# Patient Record
Sex: Male | Born: 1990 | Race: Black or African American | Hispanic: No | Marital: Married | Smoking: Never smoker
Health system: Southern US, Community
[De-identification: ages and names within clinical notes are randomized; demographics above are authoritative.]

## PROBLEM LIST (undated history)

## (undated) DIAGNOSIS — E119 Type 2 diabetes mellitus without complications: Secondary | ICD-10-CM

## (undated) HISTORY — DX: Type 2 diabetes mellitus without complications: E11.9

---
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
## 2016-04-11 ENCOUNTER — Emergency Department (HOSPITAL_BASED_OUTPATIENT_CLINIC_OR_DEPARTMENT_OTHER)
Admission: EM | Admit: 2016-04-11 | Discharge: 2016-04-11 | Disposition: A | Discharge status: Home / Self Care | Payer: Self-pay | Attending: Emergency Medicine | Admitting: Emergency Medicine

## 2016-04-11 ENCOUNTER — Encounter (HOSPITAL_BASED_OUTPATIENT_CLINIC_OR_DEPARTMENT_OTHER): Payer: Self-pay | Admitting: Emergency Medicine

## 2016-04-11 DIAGNOSIS — E119 Type 2 diabetes mellitus without complications: Secondary | ICD-10-CM | POA: Insufficient documentation

## 2016-04-11 DIAGNOSIS — A084 Viral intestinal infection, unspecified: Secondary | ICD-10-CM | POA: Insufficient documentation

## 2016-04-11 LAB — COMPREHENSIVE METABOLIC PANEL
ALT: 78 U/L — ABNORMAL HIGH (ref 17–63)
ANION GAP: 13 (ref 5–15)
AST: 49 U/L — ABNORMAL HIGH (ref 15–41)
Albumin: 4.5 g/dL (ref 3.5–5.0)
Alkaline Phosphatase: 95 U/L (ref 38–126)
BILIRUBIN TOTAL: 0.9 mg/dL (ref 0.3–1.2)
BUN: 8 mg/dL (ref 6–20)
CALCIUM: 9.1 mg/dL (ref 8.9–10.3)
CO2: 24 mmol/L (ref 22–32)
CREATININE: 0.9 mg/dL (ref 0.61–1.24)
Chloride: 97 mmol/L — ABNORMAL LOW (ref 101–111)
Glucose, Bld: 381 mg/dL — ABNORMAL HIGH (ref 65–99)
Potassium: 3.7 mmol/L (ref 3.5–5.1)
SODIUM: 134 mmol/L — AB (ref 135–145)
TOTAL PROTEIN: 7.4 g/dL (ref 6.5–8.1)

## 2016-04-11 LAB — URINALYSIS, ROUTINE W REFLEX MICROSCOPIC
Bilirubin Urine: NEGATIVE
Glucose, UA: >=500 mg/dL — AB
HGB URINE DIPSTICK: NEGATIVE
Ketones, ur: >80 mg/dL — AB
Leukocytes, UA: NEGATIVE
NITRITE: NEGATIVE
PROTEIN: NEGATIVE mg/dL
Specific Gravity, Urine: >1.046 — ABNORMAL HIGH (ref 1.005–1.030)
pH: 5 (ref 5.0–8.0)

## 2016-04-11 LAB — URINALYSIS, MICROSCOPIC (REFLEX): Bacteria, UA: NONE SEEN

## 2016-04-11 LAB — CBC WITH DIFFERENTIAL/PLATELET
BASOS ABS: 0.1 10*3/uL (ref 0.0–0.1)
BASOS PCT: 1 %
EOS ABS: 0.1 10*3/uL (ref 0.0–0.7)
Eosinophils Relative: 1 %
HEMATOCRIT: 44.9 % (ref 39.0–52.0)
HEMOGLOBIN: 14.9 g/dL (ref 13.0–17.0)
Lymphocytes Relative: 30 %
Lymphs Abs: 3 10*3/uL (ref 0.7–4.0)
MCH: 27.5 pg (ref 26.0–34.0)
MCHC: 33.2 g/dL (ref 30.0–36.0)
MCV: 82.8 fL (ref 78.0–100.0)
Monocytes Absolute: 0.8 10*3/uL (ref 0.1–1.0)
Monocytes Relative: 8 %
NEUTROS ABS: 6.1 10*3/uL (ref 1.7–7.7)
NEUTROS PCT: 60 %
Platelets: 290 10*3/uL (ref 150–400)
RBC: 5.42 MIL/uL (ref 4.22–5.81)
RDW: 12.4 % (ref 11.5–15.5)
WBC: 10.1 10*3/uL (ref 4.0–10.5)

## 2016-04-11 LAB — LIPASE, BLOOD: LIPASE: 23 U/L (ref 11–51)

## 2016-04-11 MED ORDER — SODIUM CHLORIDE 0.9 % IV BOLUS (SEPSIS)
1000.0000 mL | Freq: Once | INTRAVENOUS | Status: AC
  Administered 2016-04-11: 1000 mL via INTRAVENOUS

## 2016-04-11 MED ORDER — ONDANSETRON 8 MG PO TBDP: 8.0000 mg | ORAL_TABLET | Freq: Three times a day (TID) | ORAL | 0 refills | Status: DC | PRN

## 2016-04-11 MED ORDER — ONDANSETRON HCL 4 MG/2ML IJ SOLN
4.0000 mg | Freq: Once | INTRAMUSCULAR | Status: AC
  Administered 2016-04-11: 4 mg via INTRAVENOUS
  Filled 2016-04-11: qty 2

## 2016-04-11 MED ORDER — DICYCLOMINE HCL 10 MG/ML IM SOLN
20.0000 mg | Freq: Once | INTRAMUSCULAR | Status: AC
  Administered 2016-04-11: 20 mg via INTRAMUSCULAR
  Filled 2016-04-11: qty 2

## 2016-04-11 MED ORDER — INSULIN ASPART 100 UNIT/ML ~~LOC~~ SOLN
10.0000 [IU] | Freq: Once | SUBCUTANEOUS | Status: AC
  Administered 2016-04-11: 10 [IU] via SUBCUTANEOUS
  Filled 2016-04-11: qty 1

## 2016-04-11 MED ORDER — METFORMIN HCL 500 MG PO TABS: 500.0000 mg | ORAL_TABLET | Freq: Two times a day (BID) | ORAL | 0 refills | Status: DC

## 2016-04-11 NOTE — ED Provider Notes (Signed)
[facility] Provider Note: Erik Ford Trestin Vences, MD, FACEP  CSN: [id number] MRN: [id number] ARRIVAL: 04/11/16 at 0028 ROOM: [facility]10/[facility]10   CHIEF COMPLAINT  Abdominal Pain   HISTORY OF PRESENT ILLNESS  Erik Ford is a 26 y.o. male with a one-week history (4 day history per nurse's notes) of crampy, diffuse abdominal pain associated with nausea and the urge to vomit with very little actual emesis. He has also had the urge to move his bowels but has passed very little stool this week due to decreased food intake. He has had "a little bit" of diarrhea. He denies blood in the stool. He states he has continued to try and stay hydrated. He had a fever yesterday evening.   History reviewed. No pertinent past medical history.  History reviewed. No pertinent surgical history.  No family history on file.  Social History  Substance Use Topics  . Smoking status: Never Smoker  . Smokeless tobacco: Never Used  . Alcohol use No    Prior to Admission medications   Not on File    Allergies Patient has no known allergies.   REVIEW OF SYSTEMS  Negative except as noted here or in the History of Present Illness.   PHYSICAL EXAMINATION  Initial Vital Signs Blood pressure 133/69, pulse 79, temperature 98.8 F (37.1 C), temperature source Oral, resp. rate 18, height 5\' 6"  (1.676 m), weight 228 lb (103.4 kg), SpO2 99 %.  Examination General: Well-developed, well-nourished male in no acute distress; appearance consistent with age of record HENT: normocephalic; atraumatic Eyes: pupils equal, round and reactive to light; extraocular muscles intact Neck: supple Heart: regular rate and rhythm Lungs: clear to auscultation bilaterally Abdomen: soft; nondistended; Diffusely tender most prominent in the epigastrium and right upper quadrant; no masses or hepatosplenomegaly; bowel sounds hyperactive Extremities: No deformity; full range of motion; pulses normal Neurologic: Awake,  alert and oriented; motor function intact in all extremities and symmetric; no facial droop Skin: Warm and dry Psychiatric: Normal mood and affect   RESULTS  Summary of this visit's results, reviewed by myself:   EKG Interpretation  Date/Time:    Ventricular Rate:    PR Interval:    QRS Duration:   QT Interval:    QTC Calculation:   R Axis:     Text Interpretation:        Laboratory Studies: Results for orders placed or performed during the hospital encounter of 04/11/16 (from the past 24 hour(s))  Comprehensive metabolic panel     Status: Abnormal   Collection Time: 04/11/16  4:30 AM  Result Value Ref Range   Sodium 134 (L) 135 - 145 mmol/L   Potassium 3.7 3.5 - 5.1 mmol/L   Chloride 97 (L) 101 - 111 mmol/L   CO2 24 22 - 32 mmol/L   Glucose, Bld 381 (H) 65 - 99 mg/dL   BUN 8 6 - 20 mg/dL   Creatinine, Ser 7.820.90 0.61 - 1.24 mg/dL   Calcium 9.1 8.9 - 95.610.3 mg/dL   Total Protein 7.4 6.5 - 8.1 g/dL   Albumin 4.5 3.5 - 5.0 g/dL   AST 49 (H) 15 - 41 U/L   ALT 78 (H) 17 - 63 U/L   Alkaline Phosphatase 95 38 - 126 U/L   Total Bilirubin 0.9 0.3 - 1.2 mg/dL   GFR calc non Af Amer >60 >60 mL/min   GFR calc Af Amer >60 >60 mL/min   Anion gap 13 5 - 15  CBC with Differential/Platelet  Status: None   Collection Time: 04/11/16  4:30 AM  Result Value Ref Range   WBC 10.1 4.0 - 10.5 K/uL   RBC 5.42 4.22 - 5.81 MIL/uL   Hemoglobin 14.9 13.0 - 17.0 g/dL   HCT 53.6 64.4 - 03.4 %   MCV 82.8 78.0 - 100.0 fL   MCH 27.5 26.0 - 34.0 pg   MCHC 33.2 30.0 - 36.0 g/dL   RDW 74.2 59.5 - 63.8 %   Platelets 290 150 - 400 K/uL   Neutrophils Relative % 60 %   Neutro Abs 6.1 1.7 - 7.7 K/uL   Lymphocytes Relative 30 %   Lymphs Abs 3.0 0.7 - 4.0 K/uL   Monocytes Relative 8 %   Monocytes Absolute 0.8 0.1 - 1.0 K/uL   Eosinophils Relative 1 %   Eosinophils Absolute 0.1 0.0 - 0.7 K/uL   Basophils Relative 1 %   Basophils Absolute 0.1 0.0 - 0.1 K/uL  Lipase, blood     Status: None    Collection Time: 04/11/16  4:30 AM  Result Value Ref Range   Lipase 23 11 - 51 U/L  Urinalysis, Routine w reflex microscopic     Status: Abnormal   Collection Time: 04/11/16  5:54 AM  Result Value Ref Range   Color, Urine YELLOW YELLOW   APPearance CLEAR CLEAR   Specific Gravity, Urine >1.046 (H) 1.005 - 1.030   pH 5.0 5.0 - 8.0   Glucose, UA >=500 (A) NEGATIVE mg/dL   Hgb urine dipstick NEGATIVE NEGATIVE   Bilirubin Urine NEGATIVE NEGATIVE   Ketones, ur >80 (A) NEGATIVE mg/dL   Protein, ur NEGATIVE NEGATIVE mg/dL   Nitrite NEGATIVE NEGATIVE   Leukocytes, UA NEGATIVE NEGATIVE  Urinalysis, Microscopic (reflex)     Status: Abnormal   Collection Time: 04/11/16  5:54 AM  Result Value Ref Range   RBC / HPF 0-5 0 - 5 RBC/hpf   WBC, UA 0-5 0 - 5 WBC/hpf   Bacteria, UA NONE SEEN NONE SEEN   Squamous Epithelial / LPF 0-5 (A) NONE SEEN   Imaging Studies: No results found.  ED COURSE  Nursing notes and initial vitals signs, including pulse oximetry, reviewed.  Vitals:   04/11/16 0042 04/11/16 0245 04/11/16 0246 04/11/16 0557  BP: 132/80 133/69 133/69 121/76  Pulse: 92  79 80  Resp: 18  18 19   Temp: 98.8 F (37.1 C)   98.8 F (37.1 C)  TempSrc: Oral   Oral  SpO2: 98%  99% 99%  Weight: 228 lb (103.4 kg)     Height: 5\' 6"  (1.676 m)      6:20 AM Patient feeling much better after IV fluids and medications. Able to drink fluids without emesis. Emphasized the importance of getting his newly diagnosed diabetes.with by primary care physician on a regular basis. He intends to start looking for PCP tomorrow. We will start him on metformin in the meantime.  PROCEDURES    ED DIAGNOSES     ICD-9-CM ICD-10-CM   1. Viral gastroenteritis 008.8 A08.4   2. New onset type 2 diabetes mellitus (HCC) 250.00 E11.9        Erik Libra, MD 04/11/16 [phone]

## 2016-04-11 NOTE — ED Triage Notes (Signed)
Pt reports diffuse abdominal pain and cramping since Wednesday.  Pt states he vomited 5+ times this week, but today just feels nauseous.

## 2017-09-09 ENCOUNTER — Other Ambulatory Visit: Payer: Self-pay

## 2017-09-09 ENCOUNTER — Emergency Department (HOSPITAL_BASED_OUTPATIENT_CLINIC_OR_DEPARTMENT_OTHER): Payer: BC Managed Care – PPO

## 2017-09-09 ENCOUNTER — Emergency Department (HOSPITAL_BASED_OUTPATIENT_CLINIC_OR_DEPARTMENT_OTHER)
Admission: EM | Admit: 2017-09-09 | Discharge: 2017-09-09 | Disposition: A | Discharge status: Home / Self Care | Payer: BC Managed Care – PPO | Attending: Emergency Medicine | Admitting: Emergency Medicine

## 2017-09-09 ENCOUNTER — Encounter (HOSPITAL_BASED_OUTPATIENT_CLINIC_OR_DEPARTMENT_OTHER): Payer: Self-pay | Admitting: *Deleted

## 2017-09-09 DIAGNOSIS — Z7984 Long term (current) use of oral hypoglycemic drugs: Secondary | ICD-10-CM | POA: Diagnosis not present

## 2017-09-09 DIAGNOSIS — S299XXA Unspecified injury of thorax, initial encounter: Secondary | ICD-10-CM | POA: Diagnosis present

## 2017-09-09 DIAGNOSIS — Y92410 Unspecified street and highway as the place of occurrence of the external cause: Secondary | ICD-10-CM | POA: Diagnosis not present

## 2017-09-09 DIAGNOSIS — S20211A Contusion of right front wall of thorax, initial encounter: Secondary | ICD-10-CM | POA: Diagnosis not present

## 2017-09-09 DIAGNOSIS — Y999 Unspecified external cause status: Secondary | ICD-10-CM | POA: Insufficient documentation

## 2017-09-09 DIAGNOSIS — M79601 Pain in right arm: Secondary | ICD-10-CM | POA: Diagnosis not present

## 2017-09-09 DIAGNOSIS — Y9389 Activity, other specified: Secondary | ICD-10-CM | POA: Diagnosis not present

## 2017-09-09 MED ORDER — NAPROXEN 500 MG PO TABS: 500.0000 mg | ORAL_TABLET | Freq: Two times a day (BID) | ORAL | 0 refills | Status: DC | PRN

## 2017-09-09 MED ORDER — CYCLOBENZAPRINE HCL 10 MG PO TABS: 10.0000 mg | ORAL_TABLET | Freq: Two times a day (BID) | ORAL | 0 refills | Status: DC | PRN

## 2017-09-09 MED ORDER — NAPROXEN 250 MG PO TABS
500.0000 mg | ORAL_TABLET | Freq: Once | ORAL | Status: AC
  Administered 2017-09-09: 500 mg via ORAL
  Filled 2017-09-09: qty 2

## 2017-09-09 MED ORDER — HYDROCODONE-ACETAMINOPHEN 5-325 MG PO TABS
1.0000 | ORAL_TABLET | Freq: Once | ORAL | Status: AC
  Administered 2017-09-09: 1 via ORAL
  Filled 2017-09-09: qty 1

## 2017-09-09 MED ORDER — HYDROCODONE-ACETAMINOPHEN 5-325 MG PO TABS: 1.0000 | ORAL_TABLET | Freq: Four times a day (QID) | ORAL | 0 refills | Status: DC | PRN
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
## 2017-09-09 NOTE — ED Notes (Signed)
Patient transported to X-ray 
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
## 2019-04-23 ENCOUNTER — Ambulatory Visit: Payer: BC Managed Care – PPO | Attending: Internal Medicine

## 2019-04-23 DIAGNOSIS — Z20822 Contact with and (suspected) exposure to covid-19: Secondary | ICD-10-CM

## 2019-04-24 LAB — NOVEL CORONAVIRUS, NAA: SARS-CoV-2, NAA: NOT DETECTED

## 2019-10-30 ENCOUNTER — Ambulatory Visit: Payer: BC Managed Care – PPO | Attending: Critical Care Medicine

## 2019-10-30 DIAGNOSIS — Z23 Encounter for immunization: Secondary | ICD-10-CM

## 2019-10-30 NOTE — Progress Notes (Signed)
   Covid-19 Vaccination Clinic  Name:  TRUETT MCFARLAN    MRN: [id number] DOB: Dec 09, 1990  10/30/2019  Mr. Chay was observed post Covid-19 immunization for 15 minutes without incident. He was provided with Vaccine Information Sheet and instruction to access the V-Safe system.   Mr. Einstein was instructed to call 911 with any severe reactions post vaccine: Marland Kitchen Difficulty breathing  . Swelling of face and throat  . A fast heartbeat  . A bad rash all over body  . Dizziness and weakness   Immunizations Administered    Name Date Dose VIS Date Route   Pfizer COVID-19 Vaccine 10/30/2019  1:07 PM 0.3 mL 05/30/2018 Intramuscular   Manufacturer: [employer], [employer]   Lot: [id number]   NDC: [address]-1

## 2019-11-20 ENCOUNTER — Ambulatory Visit: Payer: Self-pay | Attending: Internal Medicine

## 2019-11-20 DIAGNOSIS — Z23 Encounter for immunization: Secondary | ICD-10-CM

## 2019-11-20 NOTE — Progress Notes (Signed)
   Covid-19 Vaccination Clinic  Name:  Erik Ford    MRN: [id number] DOB: 1991/01/25  11/20/2019  Mr. [name] was observed post Covid-19 immunization for 15 minutes without incident. He was provided with Vaccine Information Sheet and instruction to access the V-Safe system.   [name] was instructed to call 911 with any severe reactions post vaccine: Marland Kitchen Difficulty breathing  . Swelling of face and throat  . A fast heartbeat  . A bad rash all over body  . Dizziness and weakness   Immunizations Administered    Name Date Dose VIS Date Route   Pfizer COVID-19 Vaccine 11/20/2019 12:49 PM 0.3 mL 05/30/2018 Intramuscular   Manufacturer: [employer], [employer]   Lot: [id number]   NDC: [address]-6

## 2020-08-21 ENCOUNTER — Encounter: Payer: Self-pay | Admitting: Nurse Practitioner

## 2020-08-21 ENCOUNTER — Other Ambulatory Visit: Payer: Self-pay

## 2020-08-21 ENCOUNTER — Ambulatory Visit: Payer: 59 | Admitting: Nurse Practitioner

## 2020-08-21 VITALS — BP 124/78 | HR 100 | Temp 97.0°F | Ht 66.0 in | Wt 191.6 lb

## 2020-08-21 DIAGNOSIS — E1141 Type 2 diabetes mellitus with diabetic mononeuropathy: Secondary | ICD-10-CM | POA: Diagnosis not present

## 2020-08-21 DIAGNOSIS — G629 Polyneuropathy, unspecified: Secondary | ICD-10-CM

## 2020-08-21 DIAGNOSIS — E1169 Type 2 diabetes mellitus with other specified complication: Secondary | ICD-10-CM | POA: Diagnosis not present

## 2020-08-21 DIAGNOSIS — E1140 Type 2 diabetes mellitus with diabetic neuropathy, unspecified: Secondary | ICD-10-CM | POA: Insufficient documentation

## 2020-08-21 DIAGNOSIS — E1165 Type 2 diabetes mellitus with hyperglycemia: Secondary | ICD-10-CM | POA: Insufficient documentation

## 2020-08-21 DIAGNOSIS — F3289 Other specified depressive episodes: Secondary | ICD-10-CM | POA: Insufficient documentation

## 2020-08-21 LAB — CBC
HCT: 43.8 % (ref 39.0–52.0)
Hemoglobin: 14.5 g/dL (ref 13.0–17.0)
MCHC: 33.2 g/dL (ref 30.0–36.0)
MCV: 83.4 fl (ref 78.0–100.0)
Platelets: 297 10*3/uL (ref 150.0–400.0)
RBC: 5.25 Mil/uL (ref 4.22–5.81)
RDW: 12.6 % (ref 11.5–15.5)
WBC: 8.8 10*3/uL (ref 4.0–10.5)

## 2020-08-21 LAB — COMPREHENSIVE METABOLIC PANEL
ALT: 58 U/L — ABNORMAL HIGH (ref 0–53)
AST: 33 U/L (ref 0–37)
Albumin: 4.6 g/dL (ref 3.5–5.2)
Alkaline Phosphatase: 76 U/L (ref 39–117)
BUN: 11 mg/dL (ref 6–23)
CO2: 28 mEq/L (ref 19–32)
Calcium: 9.5 mg/dL (ref 8.4–10.5)
Chloride: 102 mEq/L (ref 96–112)
Creatinine, Ser: 0.92 mg/dL (ref 0.40–1.50)
GFR: 114.1 mL/min (ref >60.00)
Glucose, Bld: 334 mg/dL — ABNORMAL HIGH (ref 70–99)
Potassium: 4 mEq/L (ref 3.5–5.1)
Sodium: 138 mEq/L (ref 135–145)
Total Bilirubin: 0.3 mg/dL (ref 0.2–1.2)
Total Protein: 6.9 g/dL (ref 6.0–8.3)

## 2020-08-21 LAB — TSH: TSH: 2.63 u[IU]/mL (ref 0.35–4.50)

## 2020-08-21 LAB — GLUCOSE, POCT (MANUAL RESULT ENTRY): POC Glucose: 308 mg/dl — AB (ref 70–99)

## 2020-08-21 LAB — B12 AND FOLATE PANEL
Folate: 13.2 ng/mL (ref >5.9)
Vitamin B-12: 603 pg/mL (ref 211–911)

## 2020-08-21 LAB — MICROALBUMIN / CREATININE URINE RATIO
Creatinine,U: 125.6 mg/dL
Microalb Creat Ratio: 0.7 mg/g (ref 0.0–30.0)
Microalb, Ur: 0.9 mg/dL (ref 0.0–1.9)

## 2020-08-21 LAB — HEMOGLOBIN A1C: Hgb A1c MFr Bld: 11.7 % — ABNORMAL HIGH (ref 4.6–6.5)

## 2020-08-21 MED ORDER — GABAPENTIN 100 MG PO CAPS: 100.0000 mg | ORAL_CAPSULE | Freq: Every day | ORAL | 3 refills | Status: DC

## 2020-08-21 NOTE — Patient Instructions (Signed)
Thank you for choosing [facility]  Go to lab for blood draw and urine collection Schedule annual eye exam with ophthalmology.  Diabetes Mellitus and Nutrition, Adult When you have diabetes, or diabetes mellitus, it is very important to have healthy eating habits because your blood sugar (glucose) levels are greatly affected by what you eat and drink. Eating healthy foods in the right amounts, at about the same times every day, can help you:  Control your blood glucose.  Lower your risk of heart disease.  Improve your blood pressure.  Reach or maintain a healthy weight. What can affect my meal plan? Every person with diabetes is different, and each person has different needs for a meal plan. Your health care provider may recommend that you work with a dietitian to make a meal plan that is best for you. Your meal plan may vary depending on factors such as:  The calories you need.  The medicines you take.  Your weight.  Your blood glucose, blood pressure, and cholesterol levels.  Your activity level.  Other health conditions you have, such as heart or kidney disease. How do carbohydrates affect me? Carbohydrates, also called carbs, affect your blood glucose level more than any other type of food. Eating carbs naturally raises the amount of glucose in your blood. Carb counting is a method for keeping track of how many carbs you eat. Counting carbs is important to keep your blood glucose at a healthy level, especially if you use insulin or take certain oral diabetes medicines. It is important to know how many carbs you can safely have in each meal. This is different for every person. Your dietitian can help you calculate how many carbs you should have at each meal and for each snack. How does alcohol affect me? Alcohol can cause a sudden decrease in blood glucose (hypoglycemia), especially if you use insulin or take certain oral diabetes medicines. Hypoglycemia can be a  life-threatening condition. Symptoms of hypoglycemia, such as sleepiness, dizziness, and confusion, are similar to symptoms of having too much alcohol.  Do not drink alcohol if: ? Your health care provider tells you not to drink. ? You are pregnant, may be pregnant, or are planning to become pregnant.  If you drink alcohol: ? Do not drink on an empty stomach. ? Limit how much you use to:  0-1 drink a day for women.  0-2 drinks a day for men. ? Be aware of how much alcohol is in your drink. In the [place] one drink equals one 12 oz bottle of beer (355 mL), one 5 oz glass of wine (148 mL), or one 1 oz glass of hard liquor (44 mL). ? Keep yourself hydrated with water, diet soda, or unsweetened iced tea.  Keep in mind that regular soda, juice, and other mixers may contain a lot of sugar and must be counted as carbs. What are tips for following this plan? Reading food labels  Start by checking the serving size on the "Nutrition Facts" label of packaged foods and drinks. The amount of calories, carbs, fats, and other nutrients listed on the label is based on one serving of the item. Many items contain more than one serving per package.  Check the total grams (g) of carbs in one serving. You can calculate the number of servings of carbs in one serving by dividing the total carbs by 15. For example, if a food has 30 g of total carbs per serving, it would be equal to  2 servings of carbs.  Check the number of grams (g) of saturated fats and trans fats in one serving. Choose foods that have a low amount or none of these fats.  Check the number of milligrams (mg) of salt (sodium) in one serving. Most people should limit total sodium intake to less than 2,300 mg per day.  Always check the nutrition information of foods labeled as "low-fat" or "nonfat." These foods may be higher in added sugar or refined carbs and should be avoided.  Talk to your dietitian to identify your daily goals for nutrients  listed on the label. Shopping  Avoid buying canned, pre-made, or processed foods. These foods tend to be high in fat, sodium, and added sugar.  Shop around the outside edge of the grocery store. This is where you will most often find fresh fruits and vegetables, bulk grains, fresh meats, and fresh dairy. Cooking  Use low-heat cooking methods, such as baking, instead of high-heat cooking methods like deep frying.  Cook using healthy oils, such as olive, canola, or sunflower oil.  Avoid cooking with butter, cream, or high-fat meats. Meal planning  Eat meals and snacks regularly, preferably at the same times every day. Avoid going long periods of time without eating.  Eat foods that are high in fiber, such as fresh fruits, vegetables, beans, and whole grains. Talk with your dietitian about how many servings of carbs you can eat at each meal.  Eat 4-6 oz (112-168 g) of lean protein each day, such as lean meat, chicken, fish, eggs, or tofu. One ounce (oz) of lean protein is equal to: ? 1 oz (28 g) of meat, chicken, or fish. ? 1 egg. ?  cup (62 g) of tofu.  Eat some foods each day that contain healthy fats, such as avocado, nuts, seeds, and fish.   What foods should I eat? Fruits Berries. Apples. Oranges. Peaches. Apricots. Plums. Grapes. Mango. Papaya. Pomegranate. Kiwi. Cherries. Vegetables Lettuce. Spinach. Leafy greens, including kale, chard, collard greens, and mustard greens. Beets. Cauliflower. Cabbage. Broccoli. Carrots. Green beans. Tomatoes. Peppers. Onions. Cucumbers. Brussels sprouts. Grains Whole grains, such as whole-wheat or whole-grain bread, crackers, tortillas, cereal, and pasta. Unsweetened oatmeal. Quinoa. Brown or wild rice. Meats and other proteins Seafood. Poultry without skin. Lean cuts of poultry and beef. Tofu. Nuts. Seeds. Dairy Low-fat or fat-free dairy products such as milk, yogurt, and cheese. The items listed above may not be a complete list of foods and  beverages you can eat. Contact a dietitian for more information. What foods should I avoid? Fruits Fruits canned with syrup. Vegetables Canned vegetables. Frozen vegetables with butter or cream sauce. Grains Refined white flour and flour products such as bread, pasta, snack foods, and cereals. Avoid all processed foods. Meats and other proteins Fatty cuts of meat. Poultry with skin. Breaded or fried meats. Processed meat. Avoid saturated fats. Dairy Full-fat yogurt, cheese, or milk. Beverages Sweetened drinks, such as soda or iced tea. The items listed above may not be a complete list of foods and beverages you should avoid. Contact a dietitian for more information. Questions to ask a health care provider  Do I need to meet with a diabetes educator?  Do I need to meet with a dietitian?  What number can I call if I have questions?  When are the best times to check my blood glucose? Where to find more information:  American Diabetes Association: diabetes.org  Academy of Nutrition and Dietetics: www.eatright.[employer] of Diabetes  and Digestive and Kidney Diseases: CarFlippers.tn  Association of Diabetes Care and Education Specialists: www.diabeteseducator.org Summary  It is important to have healthy eating habits because your blood sugar (glucose) levels are greatly affected by what you eat and drink.  A healthy meal plan will help you control your blood glucose and maintain a healthy lifestyle.  Your health care provider may recommend that you work with a dietitian to make a meal plan that is best for you.  Keep in mind that carbohydrates (carbs) and alcohol have immediate effects on your blood glucose levels. It is important to count carbs and to use alcohol carefully. This information is not intended to replace advice given to you by your health care provider. Make sure you discuss any questions you have with your health care provider. Document Revised:  02/27/2019 Document Reviewed: 02/27/2019 Elsevier Patient Education  2021 [employer].

## 2020-08-21 NOTE — Progress Notes (Addendum)
Subjective:  Patient ID: Erik Ford, male    DOB: December 09, 1990  Age: 30 y.o. MRN: [id number]  CC: Establish Care (New patient/Pt c/o feet aching and a sensation similar to nerve pain x 6 months or more. Pt denies any swelling. )  Diabetes He presents for his initial diabetic visit. He has type 2 diabetes mellitus. No MedicAlert identification noted. His disease course has been stable. There are no hypoglycemic associated symptoms. Associated symptoms include foot paresthesias. Pertinent negatives for diabetes include no blurred vision, no chest pain, no fatigue, no foot ulcerations, no polydipsia, no polyphagia, no polyuria, no visual change, no weakness and no weight loss. There are no hypoglycemic complications. Symptoms are worsening. There are no diabetic complications. Risk factors for coronary artery disease include male sex. Current diabetic treatment includes diet. He is compliant with treatment most of the time.   Reviewed past Medical, Social and Family history today.  Outpatient Medications Prior to Visit  Medication Sig Dispense Refill  . cyclobenzaprine (FLEXERIL) 10 MG tablet Take 1 tablet (10 mg total) by mouth 2 (two) times daily as needed for muscle spasms. (Patient not taking: Reported on 08/21/2020) 20 tablet 0  . HYDROcodone-acetaminophen (NORCO/VICODIN) 5-325 MG tablet Take 1 tablet by mouth every 6 (six) hours as needed for severe pain. (Patient not taking: Reported on 08/21/2020) 5 tablet 0  . metFORMIN (GLUCOPHAGE) 500 MG tablet Take 1 tablet (500 mg total) by mouth 2 (two) times daily with a meal. (Patient not taking: Reported on 08/21/2020) 60 tablet 0  . naproxen (NAPROSYN) 500 MG tablet Take 1 tablet (500 mg total) by mouth 2 (two) times daily as needed. (Patient not taking: Reported on 08/21/2020) 30 tablet 0  . ondansetron (ZOFRAN ODT) 8 MG disintegrating tablet Take 1 tablet (8 mg total) by mouth every 8 (eight) hours as needed for nausea or vomiting. (Patient  not taking: Reported on 08/21/2020) 10 tablet 0   No facility-administered medications prior to visit.    ROS See HPI  Objective:  BP 124/78 (BP Location: Left Arm, Patient Position: Sitting, Cuff Size: Large)   Pulse 100   Temp (!) 97 F (36.1 C) (Temporal)   Ht 5' 6"  (1.676 m)   Wt 191 lb 9.6 oz (86.9 kg)   SpO2 98%   BMI 30.93 kg/m   Physical Exam Constitutional:      Appearance: He is obese.  Cardiovascular:     Rate and Rhythm: Normal rate.     Pulses: Normal pulses.  Pulmonary:     Effort: Pulmonary effort is normal.  Musculoskeletal:     Right lower leg: No edema.     Left lower leg: No edema.  Neurological:     Mental Status: He is alert and oriented to person, place, and time.    Assessment & Plan:  This visit occurred during the SARS-CoV-2 public health emergency.  Safety protocols were in place, including screening questions prior to the visit, additional usage of staff PPE, and extensive cleaning of exam room while observing appropriate contact time as indicated for disinfecting solutions.   Larenzo was seen today for establish care.  Diagnoses and all orders for this visit:  Type 2 diabetes mellitus with other specified complication, without long-term current use of insulin (HCC) -     Hemoglobin A1c -     Microalbumin / creatinine urine ratio -     Comprehensive metabolic panel -     POCT Glucose (CBG) -  glipiZIDE-metformin (METAGLIP) 2.5-500 MG tablet; Take 1 tablet by mouth 2 (two) times daily before a meal. -     blood glucose meter kit and supplies KIT; Dispense based on patient and insurance preference. Use once a day before breakfast. ICD10 E11.69  Diabetic mononeuropathy associated with type 2 diabetes mellitus (HCC) -     TSH -     B12 and Folate Panel -     CBC -     gabapentin (NEURONTIN) 100 MG capsule; Take 1 capsule (100 mg total) by mouth at bedtime. -     blood glucose meter kit and supplies KIT; Dispense based on patient and  insurance preference. Use once a day before breakfast. ICD10 E11.69   Problem List Items Addressed This Visit      Endocrine   Diabetic neuropathy ([place])    Onset 50month ago, worsening, intermittent. No tobacco or ETOH use.  Start gabapentin at hs Check cbc, TSH, B12, CMP      Relevant Medications   gabapentin (NEURONTIN) 100 MG capsule   glipiZIDE-metformin (METAGLIP) 2.5-500 MG tablet   blood glucose meter kit and supplies KIT   Other Relevant Orders   TSH (Completed)   B12 and Folate Panel (Completed)   CBC (Completed)   Type 2 diabetes mellitus with hyperglycemia, without long-term current use of insulin ([facility] - Primary    New diagnosis Hyperglycemia first noted 2018, but he reports he was unaware of lab results and metformin prescription. Random glucose today:308 No FHx of DM or HTN or CAD. No tobacco or ETOH use. He declined referral to nutritionist today Provided printed information on DM and nutrition.  Check TSH, CMP, HgbA1c and urine microalbumin: normal renal function and urine microalbumin Start metformin/glipizide 2.5-500 BID F/up in 167monthlucometer provided      Relevant Medications   glipiZIDE-metformin (METAGLIP) 2.5-500 MG tablet   blood glucose meter kit and supplies KIT      Follow-up: Return in about 4 weeks (around 09/18/2020) for DM and neuropathy.  ChWilfred LacyNP

## 2020-08-21 NOTE — Assessment & Plan Note (Signed)
Onset 68months ago, worsening, intermittent. No tobacco or ETOH use.  Start gabapentin at hs Check cbc, TSH, B12, CMP

## 2020-08-21 NOTE — Assessment & Plan Note (Addendum)
New diagnosis Hyperglycemia first noted 2018, but he reports he was unaware of lab results and metformin prescription. Random glucose today:308 No FHx of DM or HTN or CAD. No tobacco or ETOH use. He declined referral to nutritionist today Provided printed information on DM and nutrition.  Check TSH, CMP, HgbA1c and urine microalbumin: normal renal function and urine microalbumin Start metformin/glipizide 2.5-500 BID F/up in 82month Glucometer provided

## 2020-08-22 MED ORDER — BLOOD GLUCOSE MONITOR KIT
PACK | 1 refills | Status: AC
Start: 2020-08-22 — End: ?

## 2020-08-22 MED ORDER — GLIPIZIDE-METFORMIN HCL 2.5-500 MG PO TABS: 1.0000 | ORAL_TABLET | Freq: Two times a day (BID) | ORAL | 5 refills | Status: DC

## 2020-08-22 NOTE — Addendum Note (Signed)
Addended by: Michaela Corner on: 08/22/2020 06:05 PM   Modules accepted: Orders

## 2020-09-22 ENCOUNTER — Ambulatory Visit: Payer: 59 | Admitting: Nurse Practitioner

## 2020-09-22 ENCOUNTER — Other Ambulatory Visit: Payer: Self-pay

## 2020-09-22 ENCOUNTER — Encounter: Payer: Self-pay | Admitting: Nurse Practitioner

## 2020-09-22 VITALS — BP 118/76 | HR 79 | Temp 98.7°F | Ht 66.0 in | Wt 188.4 lb

## 2020-09-22 DIAGNOSIS — E1141 Type 2 diabetes mellitus with diabetic mononeuropathy: Secondary | ICD-10-CM

## 2020-09-22 DIAGNOSIS — E1165 Type 2 diabetes mellitus with hyperglycemia: Secondary | ICD-10-CM

## 2020-09-22 DIAGNOSIS — Z23 Encounter for immunization: Secondary | ICD-10-CM

## 2020-09-22 MED ORDER — GLIPIZIDE ER 5 MG PO TB24
5.0000 mg | ORAL_TABLET | Freq: Every day | ORAL | 1 refills | Status: DC
  Filled 2020-10-08 – 2020-11-14 (×2): qty 90, 90d supply, fill #0

## 2020-09-22 MED ORDER — METFORMIN HCL ER 500 MG PO TB24
500.0000 mg | ORAL_TABLET | Freq: Every day | ORAL | 1 refills | Status: DC
  Filled 2020-10-08 – 2020-11-14 (×2): qty 90, 90d supply, fill #0

## 2020-09-22 MED ORDER — GABAPENTIN 100 MG PO CAPS: 100.0000 mg | ORAL_CAPSULE | Freq: Every day | ORAL | 3 refills | Status: DC

## 2020-09-22 NOTE — Assessment & Plan Note (Signed)
Improved with gabapentin Refill sent

## 2020-09-22 NOTE — Patient Instructions (Signed)
Medication changed to metformin XR and glipizide XR. F/up in 19months

## 2020-09-22 NOTE — Progress Notes (Signed)
Subjective:  Patient ID: Erik Ford, male    DOB: 1990-04-16  Age: 30 y.o. MRN: [id number]  CC: Follow-up (4 week follow up DM and neuropathy)   HPI  Diabetic neuropathy ([place]) Improved with gabapentin Refill sent  Type 2 diabetes mellitus with hyperglycemia, without long-term current use of insulin (HCC) Fasting glucose: 140-180 Unable to tolerate glipizide/metformin 2.5-500 BID (ABD pain and diarrhea)  Changed to metformin 521m XR and glipizide 54mXR. Administered pneumovax 23 today F/up in 59m63montht Readings from Last 3 Encounters:  09/22/20 188 lb 6.4 oz (85.5 kg)  08/21/20 191 lb 9.6 oz (86.9 kg)  09/09/17 195 lb (88.5 kg)    Reviewed past Medical, Social and Family history today.  Outpatient Medications Prior to Visit  Medication Sig Dispense Refill   blood glucose meter kit and supplies KIT Dispense based on patient and insurance preference. Use once a day before breakfast. ICD10 E11.69 1 each 1   gabapentin (NEURONTIN) 100 MG capsule Take 1 capsule (100 mg total) by mouth at bedtime. 30 capsule 3   glipiZIDE-metformin (METAGLIP) 2.5-500 MG tablet Take 1 tablet by mouth 2 (two) times daily before a meal. 60 tablet 5   FREESTYLE LITE test strip daily.     Lancets (FREESTYLE) lancets daily.     No facility-administered medications prior to visit.    ROS See HPI  Objective:  BP 118/76   Pulse 79   Temp 98.7 F (37.1 C)   Ht _0  (1.676 m)   Wt 188 lb 6.4 oz (85.5 kg)   SpO2 99%   BMI 30.41 kg/m   Physical Exam Vitals reviewed.  Cardiovascular:     Rate and Rhythm: Normal rate.     Pulses: Normal pulses.  Pulmonary:     Effort: Pulmonary effort is normal.  Neurological:     Mental Status: He is alert and oriented to person, place, and time.    Assessment & Plan:  This visit occurred during the SARS-CoV-2 public health emergency.  Safety protocols were in place, including screening questions prior to the visit, additional usage of  staff PPE, and extensive cleaning of exam room while observing appropriate contact time as indicated for disinfecting solutions.   [name] was seen today for follow-up.  Diagnoses and all orders for this visit:  Type 2 diabetes mellitus with hyperglycemia, without long-term current use of insulin (HCC) -     metFORMIN (GLUCOPHAGE XR) 500 MG 24 hr tablet; Take 1 tablet (500 mg total) by mouth daily with breakfast. -     glipiZIDE (GLUCOTROL XL) 5 MG 24 hr tablet; Take 1 tablet (5 mg total) by mouth daily with supper.  Diabetic mononeuropathy associated with type 2 diabetes mellitus (HCC) -     gabapentin (NEURONTIN) 100 MG capsule; Take 1 capsule (100 mg total) by mouth at bedtime.   Problem List Items Addressed This Visit       Endocrine   Diabetic neuropathy ([facility]  Improved with gabapentin Refill sent       Relevant Medications   gabapentin (NEURONTIN) 100 MG capsule   metFORMIN (GLUCOPHAGE XR) 500 MG 24 hr tablet   glipiZIDE (GLUCOTROL XL) 5 MG 24 hr tablet   Type 2 diabetes mellitus with hyperglycemia, without long-term current use of insulin (HCC) - Primary    Fasting glucose: 140-180 Unable to tolerate glipizide/metformin 2.5-500 BID (ABD pain and diarrhea)  Changed to metformin 500m93m and glipizide 5mg 60m Administered pneumovax 23 today F/up  in 40month       Relevant Medications   metFORMIN (GLUCOPHAGE XR) 500 MG 24 hr tablet   glipiZIDE (GLUCOTROL XL) 5 MG 24 hr tablet    Follow-up: Return in about 2 months (around 11/22/2020) for DM (fasting).  Erik Lacy NP

## 2020-09-22 NOTE — Addendum Note (Signed)
Addended by: Becky Sax on: 09/22/2020 08:36 AM   Modules accepted: Orders

## 2020-09-22 NOTE — Assessment & Plan Note (Signed)
Fasting glucose: 140-180 Unable to tolerate glipizide/metformin 2.5-500 BID (ABD pain and diarrhea)  Changed to metformin 500mg  XR and glipizide 5mg  XR. Administered pneumovax 23 today F/up in 4months

## 2020-10-02 ENCOUNTER — Other Ambulatory Visit (HOSPITAL_COMMUNITY): Payer: Self-pay

## 2020-10-07 ENCOUNTER — Other Ambulatory Visit (HOSPITAL_COMMUNITY): Payer: Self-pay

## 2020-10-07 MED ORDER — GABAPENTIN 100 MG PO CAPS
100.0000 mg | ORAL_CAPSULE | Freq: Every day | ORAL | 3 refills | Status: DC
  Filled 2020-10-07: qty 90, 90d supply, fill #0

## 2020-10-08 ENCOUNTER — Other Ambulatory Visit: Payer: Self-pay | Admitting: Nurse Practitioner

## 2020-10-08 ENCOUNTER — Other Ambulatory Visit (HOSPITAL_COMMUNITY): Payer: Self-pay

## 2020-10-08 DIAGNOSIS — E1165 Type 2 diabetes mellitus with hyperglycemia: Secondary | ICD-10-CM

## 2020-10-08 MED ORDER — FREESTYLE LANCETS MISC
1 refills | Status: AC
  Filled 2020-10-08: qty 100, 90d supply, fill #0

## 2020-10-08 MED ORDER — FREESTYLE LITE TEST VI STRP
ORAL_STRIP | 1 refills | Status: AC
  Filled 2020-10-08 – 2020-11-28 (×2): qty 50, 50d supply, fill #0

## 2020-10-16 ENCOUNTER — Other Ambulatory Visit (HOSPITAL_COMMUNITY): Payer: Self-pay

## 2020-11-07 ENCOUNTER — Telehealth: Payer: Self-pay | Admitting: Nurse Practitioner

## 2020-11-07 DIAGNOSIS — E1165 Type 2 diabetes mellitus with hyperglycemia: Secondary | ICD-10-CM

## 2020-11-07 MED ORDER — METFORMIN HCL ER 500 MG PO TB24: 500.0000 mg | ORAL_TABLET | Freq: Every day | ORAL | 1 refills | Status: DC

## 2020-11-07 NOTE — Telephone Encounter (Signed)
What is the name of the medication? metFORMIN (GLUCOPHAGE XR) 500 MG 24 hr tablet [[id number]] and glipiZIDE (GLUCOTROL XL) 5 MG 24 hr tablet [[id number]]   Have you contacted your pharmacy to request a refill? He is needing refills.  Which pharmacy would you like this sent to? [facility] on [address]   Patient notified that their request is being sent to the clinical staff for review and that they should receive a call once it is complete. If they do not receive a call within 72 hours they can check with their pharmacy or our office.

## 2020-11-07 NOTE — Telephone Encounter (Signed)
Chart supports Rx 

## 2020-11-14 ENCOUNTER — Other Ambulatory Visit (HOSPITAL_COMMUNITY): Payer: Self-pay

## 2020-11-24 ENCOUNTER — Other Ambulatory Visit: Payer: Self-pay

## 2020-11-24 ENCOUNTER — Ambulatory Visit: Payer: 59 | Admitting: Nurse Practitioner

## 2020-11-24 ENCOUNTER — Other Ambulatory Visit (HOSPITAL_BASED_OUTPATIENT_CLINIC_OR_DEPARTMENT_OTHER): Payer: Self-pay

## 2020-11-24 ENCOUNTER — Encounter: Payer: Self-pay | Admitting: Nurse Practitioner

## 2020-11-24 VITALS — BP 106/70 | HR 92 | Temp 97.7°F | Wt 191.4 lb

## 2020-11-24 DIAGNOSIS — M2142 Flat foot [pes planus] (acquired), left foot: Secondary | ICD-10-CM | POA: Diagnosis not present

## 2020-11-24 DIAGNOSIS — M2141 Flat foot [pes planus] (acquired), right foot: Secondary | ICD-10-CM

## 2020-11-24 DIAGNOSIS — E1141 Type 2 diabetes mellitus with diabetic mononeuropathy: Secondary | ICD-10-CM

## 2020-11-24 DIAGNOSIS — E1165 Type 2 diabetes mellitus with hyperglycemia: Secondary | ICD-10-CM

## 2020-11-24 LAB — POCT GLYCOSYLATED HEMOGLOBIN (HGB A1C): Hemoglobin A1C: 7.3 % — AB (ref 4.0–5.6)

## 2020-11-24 MED ORDER — GABAPENTIN 100 MG PO CAPS
200.0000 mg | ORAL_CAPSULE | Freq: Every day | ORAL | 3 refills | Status: DC
Start: 2020-11-24 — End: 2021-12-22
  Filled 2020-11-24 – 2021-04-17 (×3): qty 180, 90d supply, fill #0
  Filled 2021-04-17 – 2021-07-29 (×2): qty 180, 90d supply, fill #1
  Filled 2021-11-02: qty 180, 90d supply, fill #2

## 2020-11-24 MED ORDER — GLIPIZIDE ER 5 MG PO TB24
5.0000 mg | ORAL_TABLET | Freq: Every day | ORAL | 1 refills | Status: DC
  Filled 2020-11-24: qty 90, 90d supply, fill #0

## 2020-11-24 MED ORDER — METFORMIN HCL ER 500 MG PO TB24
500.0000 mg | ORAL_TABLET | Freq: Every day | ORAL | 1 refills | Status: DC
  Filled 2020-11-24: qty 90, 90d supply, fill #0

## 2020-11-24 NOTE — Assessment & Plan Note (Signed)
Controlled with metformin and glipizide Repeat HgbA1c at 7.3% BP Readings from Last 3 Encounters:  11/24/20 106/70  09/22/20 118/76  08/21/20 124/78   Has diabetic eye appt today, advised to have report faxed to office. Maintain current medications F/up in 8months

## 2020-11-24 NOTE — Progress Notes (Signed)
Subjective:  Patient ID: Erik Ford, male    DOB: July 29, 1990  Age: 30 y.o. MRN: [id number]  CC: Follow-up (2 month f/u on DM, pt is fasting./Checks blood sugars at home and they range 109-165)  HPI  Diabetic neuropathy (HCC) Waxing and waning pain on top of foot, describes as increased sensitivity, intermittent.  Increase gabapentin to 242m at hs Entered referral to podiatry Advised to use shoe insert for arch support  Type 2 diabetes mellitus with hyperglycemia, without long-term current use of insulin (HCC) Controlled with metformin and glipizide Repeat HgbA1c at 7.3% BP Readings from Last 3 Encounters:  11/24/20 106/70  09/22/20 118/76  08/21/20 124/78   Has diabetic eye appt today, advised to have report faxed to office. Maintain current medications F/up in 338monthWt Readings from Last 3 Encounters:  11/24/20 191 lb 6.4 oz (86.8 kg)  09/22/20 188 lb 6.4 oz (85.5 kg)  08/21/20 191 lb 9.6 oz (86.9 kg)    Reviewed past Medical, Social and Family history today.  Outpatient Medications Prior to Visit  Medication Sig Dispense Refill   blood glucose meter kit and supplies KIT Dispense based on patient and insurance preference. Use once a day before breakfast. ICD10 E11.69 1 each 1   FREESTYLE LITE test strip daily.     glucose blood (FREESTYLE LITE) test strip Use to test blood sugar once daily 50 each 1   Lancets (FREESTYLE) lancets daily.     Lancets (FREESTYLE) lancets Use to test blood glucose once daily 100 each 1   gabapentin (NEURONTIN) 100 MG capsule Take 1 capsule (100 mg total) by mouth at bedtime. 90 capsule 3   gabapentin (NEURONTIN) 100 MG capsule Take 1 capsule (100 mg total) by mouth at bedtime. 90 capsule 3   glipiZIDE (GLUCOTROL XL) 5 MG 24 hr tablet Take 1 tablet (5 mg total) by mouth daily with supper. 90 tablet 1   metFORMIN (GLUCOPHAGE XR) 500 MG 24 hr tablet Take 1 tablet (500 mg total) by mouth daily with breakfast. 90 tablet 1    metFORMIN (GLUCOPHAGE XR) 500 MG 24 hr tablet Take 1 tablet (500 mg total) by mouth daily with breakfast. 90 tablet 1   No facility-administered medications prior to visit.    ROS See HPI  Objective:  BP 106/70 (BP Location: Left Arm, Patient Position: Sitting, Cuff Size: Large)   Pulse 92   Temp 97.7 F (36.5 C) (Temporal)   Wt 191 lb 6.4 oz (86.8 kg)   SpO2 98%   BMI 30.89 kg/m   Physical Exam Vitals reviewed.  Cardiovascular:     Rate and Rhythm: Normal rate.     Pulses: Normal pulses.          Dorsalis pedis pulses are 2+ on the right side and 2+ on the left side.       Posterior tibial pulses are 2+ on the right side and 2+ on the left side.  Pulmonary:     Effort: Pulmonary effort is normal.  Musculoskeletal:        General: No swelling or tenderness.     Right lower leg: No edema.     Left lower leg: No edema.     Right foot: Normal range of motion.     Left foot: Normal range of motion.  Feet:     Right foot:     Protective Sensation: 5 sites tested.  5 sites sensed.     Skin integrity: Skin integrity normal.  Toenail Condition: Right toenails are normal.     Left foot:     Protective Sensation: 5 sites tested.  5 sites sensed.     Skin integrity: Skin integrity normal.     Toenail Condition: Left toenails are normal.  Skin:    General: Skin is warm and dry.  Neurological:     Mental Status: He is alert and oriented to person, place, and time.  Psychiatric:        Mood and Affect: Mood normal.        Behavior: Behavior normal.        Thought Content: Thought content normal.   Assessment & Plan:  This visit occurred during the SARS-CoV-2 public health emergency.  Safety protocols were in place, including screening questions prior to the visit, additional usage of staff PPE, and extensive cleaning of exam room while observing appropriate contact time as indicated for disinfecting solutions.   Esli was seen today for follow-up.  Diagnoses and all  orders for this visit:  Type 2 diabetes mellitus with hyperglycemia, without long-term current use of insulin (HCC) -     POCT glycosylated hemoglobin (Hb A1C) -     metFORMIN (GLUCOPHAGE XR) 500 MG 24 hr tablet; Take 1 tablet (500 mg total) by mouth daily with breakfast. -     glipiZIDE (GLUCOTROL XL) 5 MG 24 hr tablet; Take 1 tablet (5 mg total) by mouth daily with supper. -     Ambulatory referral to Podiatry -     For home use only DME Other see comment  Diabetic mononeuropathy associated with type 2 diabetes mellitus ([place]) -     gabapentin (NEURONTIN) 100 MG capsule; Take 2 capsules (200 mg total) by mouth at bedtime. -     Ambulatory referral to Podiatry -     For home use only DME Other see comment  Pes planus of both feet -     Ambulatory referral to Podiatry -     For home use only DME Other see comment   Problem List Items Addressed This Visit       Endocrine   Diabetic neuropathy ([place])    Waxing and waning pain on top of foot, describes as increased sensitivity, intermittent.  Increase gabapentin to 264m at hs Entered referral to podiatry Advised to use shoe insert for arch support      Relevant Medications   gabapentin (NEURONTIN) 100 MG capsule   metFORMIN (GLUCOPHAGE XR) 500 MG 24 hr tablet   glipiZIDE (GLUCOTROL XL) 5 MG 24 hr tablet   Other Relevant Orders   Ambulatory referral to Podiatry   For home use only DME Other see comment   Type 2 diabetes mellitus with hyperglycemia, without long-term current use of insulin ([place] - Primary    Controlled with metformin and glipizide Repeat HgbA1c at 7.3% BP Readings from Last 3 Encounters:  11/24/20 106/70  09/22/20 118/76  08/21/20 124/78   Has diabetic eye appt today, advised to have report faxed to office. Maintain current medications F/up in 378month     Relevant Medications   metFORMIN (GLUCOPHAGE XR) 500 MG 24 hr tablet   glipiZIDE (GLUCOTROL XL) 5 MG 24 hr tablet   Other Relevant Orders   POCT  glycosylated hemoglobin (Hb A1C) (Completed)   Ambulatory referral to Podiatry   For home use only DME Other see comment   Other Visit Diagnoses     Pes planus of both feet  Relevant Orders   Ambulatory referral to Podiatry   For home use only DME Other see comment       Follow-up: Return in about 3 months (around 02/24/2021) for CPE (fasting).  Wilfred Lacy, NP

## 2020-11-24 NOTE — Patient Instructions (Signed)
Increase gabapentin dose to 200mg  at bedtime Maintain other medication doses F/up in 51months for CPE

## 2020-11-24 NOTE — Assessment & Plan Note (Signed)
Waxing and waning pain on top of foot, describes as increased sensitivity, intermittent.  Increase gabapentin to 200mg  at hs Entered referral to podiatry Advised to use shoe insert for arch support

## 2020-11-28 ENCOUNTER — Other Ambulatory Visit: Payer: Self-pay

## 2020-11-28 ENCOUNTER — Ambulatory Visit (INDEPENDENT_AMBULATORY_CARE_PROVIDER_SITE_OTHER): Payer: 59

## 2020-11-28 ENCOUNTER — Ambulatory Visit (INDEPENDENT_AMBULATORY_CARE_PROVIDER_SITE_OTHER): Payer: 59 | Admitting: Podiatrist

## 2020-11-28 ENCOUNTER — Other Ambulatory Visit (HOSPITAL_COMMUNITY): Payer: Self-pay

## 2020-11-28 ENCOUNTER — Encounter: Payer: Self-pay | Admitting: Podiatrist

## 2020-11-28 DIAGNOSIS — M792 Neuralgia and neuritis, unspecified: Secondary | ICD-10-CM | POA: Diagnosis not present

## 2020-11-28 DIAGNOSIS — M7752 Other enthesopathy of left foot: Secondary | ICD-10-CM

## 2020-11-28 DIAGNOSIS — M79672 Pain in left foot: Secondary | ICD-10-CM

## 2020-11-28 NOTE — Patient Instructions (Signed)
Wear a good fitting shoe-  Running shoe brands I recommend are [employer], [employer] and [employer] .    Always have a shoe fit specialist help you choose your shoes as there are many "varieties" of shoes and they can find you the best fit.  Fleet Feet sports/ [employer]-Running ([place], [place], [place]), [employer], [employer] [place]) have trained staff to help you in this process.  The [employer] in [place] has a great selection of euro-comfort casual shoes with good comfort and support as well.  Avoid prolonged use of thin ballet type flats, or flip flops.  Everyday use of these shoes can actually cause foot problems or injuries.

## 2020-11-28 NOTE — Progress Notes (Signed)
Chief Complaint  Patient presents with   Pain    Pain at Lt midfoot and Lt 4th-5th toes x 1 wk - no injury - pain used to be worse but [facility] Rx'd Gaba and now feels some better; 5/10 - no sweling - little numbness and tingling Tx: Gaba     HPI: Patient is 30 y.o. male who presents today for the concerns as listed above. He relates has been having pain in the midfoot arch area of the left foot and pain of the fourth and fifth toes for about a week.  He denies any recent injury.  He saw his primary care physician and was recently diagnosed with diabetes and was also prescribed gabapentin he states the gabapentin has significantly helped his foot pain.   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 Use once a day before breakfast. ICD10 E11.69 1 each 1   FREESTYLE LITE test strip daily.     gabapentin (NEURONTIN) 100 MG capsule Take 2 capsules (200 mg total) by mouth at bedtime. 180 capsule 3   glipiZIDE (GLUCOTROL XL) 5 MG 24 hr tablet Take 1 tablet (5 mg total) by mouth daily with supper. 90 tablet 1   glucose blood (FREESTYLE LITE) test strip Use to test blood sugar once daily 50 each 1   Lancets (FREESTYLE) lancets daily.     Lancets (FREESTYLE) lancets Use to test blood glucose once daily 100 each 1   metFORMIN (GLUCOPHAGE XR) 500 MG 24 hr tablet Take 1 tablet (500 mg total) by mouth daily with breakfast. 90 tablet 1   No current facility-administered medications on file prior to visit.    No Known Allergies  Review of Systems No fevers, chills, nausea, muscle aches, no difficulty breathing, no calf pain,  no chest pain or shortness of breath.   Physical Exam  GENERAL APPEARANCE: Alert, conversant. Appropriately groomed. No acute distress.   VASCULAR: Pedal pulses palpable DP and PT bilateral.  Capillary refill time is immediate to all digits,  Proximal to distal cooling it warm to warm.  Digital perfusion adequate.   NEUROLOGIC: sensation is intact to 5.07 monofilament at 5/5 sites bilateral.  Light touch is intact bilateral, vibratory sensation intact bilateral  MUSCULOSKELETAL: acceptable muscle strength, tone and stability bilateral.  No gross boney pedal deformities noted.  Moderate pain medial arch is noted on the left foot.  No pain with range of motion of lesser digits is seen.  DERMATOLOGIC: skin is warm, supple, and dry.  No open lesions noted.  No rash, no pre ulcerative lesions. Digital nails are asymptomatic.    X-ray evaluation 3 views of the left foot are obtained.  No sign of dislocation, or fracture noted.  No sign of arthritic changes within the digits.  Adductovarus rotation of the left fifth digit is noted.  No sign of arthritic changes within the joints noted.    Assessment   1. Capsulitis of toe of left foot   2. Neuropathic pain of left foot      Plan  Discussed x-ray findings and exam findings with the patient.  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  Encouraged him to continue taking the gabapentin if it is helping.  Also dispensed power step inserts for him to use in his shoe and discussed shoe gear recommendations.  He will try these and if he continues to have pain he will call to be seen for follow-up otherwise he will be seen back as needed.

## 2020-12-01 ENCOUNTER — Other Ambulatory Visit: Payer: Self-pay | Admitting: Podiatrist

## 2020-12-01 DIAGNOSIS — M7752 Other enthesopathy of left foot: Secondary | ICD-10-CM

## 2020-12-01 DIAGNOSIS — E119 Type 2 diabetes mellitus without complications: Secondary | ICD-10-CM | POA: Diagnosis not present

## 2020-12-01 DIAGNOSIS — E1136 Type 2 diabetes mellitus with diabetic cataract: Secondary | ICD-10-CM | POA: Diagnosis not present

## 2020-12-01 DIAGNOSIS — H5213 Myopia, bilateral: Secondary | ICD-10-CM | POA: Diagnosis not present

## 2020-12-09 HISTORY — PX: CATARACT EXTRACTION, BILATERAL: SHX1313

## 2020-12-18 ENCOUNTER — Other Ambulatory Visit (HOSPITAL_COMMUNITY): Payer: Self-pay

## 2020-12-18 MED ORDER — MOXIFLOXACIN HCL 0.5 % OP SOLN
1.0000 [drp] | Freq: Four times a day (QID) | OPHTHALMIC | 1 refills | Status: DC
  Filled 2020-12-18 – 2020-12-23 (×3): qty 3, 15d supply, fill #0

## 2020-12-18 MED ORDER — PREDNISOLONE ACETATE 1 % OP SUSP
1.0000 [drp] | Freq: Four times a day (QID) | OPHTHALMIC | 0 refills | Status: DC
  Filled 2020-12-18 – 2020-12-23 (×3): qty 10, 50d supply, fill #0

## 2020-12-23 ENCOUNTER — Other Ambulatory Visit (HOSPITAL_COMMUNITY): Payer: Self-pay

## 2021-01-01 DIAGNOSIS — H25042 Posterior subcapsular polar age-related cataract, left eye: Secondary | ICD-10-CM | POA: Diagnosis not present

## 2021-01-01 DIAGNOSIS — H268 Other specified cataract: Secondary | ICD-10-CM | POA: Diagnosis not present

## 2021-01-02 ENCOUNTER — Other Ambulatory Visit (HOSPITAL_COMMUNITY): Payer: Self-pay

## 2021-01-02 MED ORDER — PREDNISOLONE ACETATE 1 % OP SUSP
1.0000 [drp] | Freq: Four times a day (QID) | OPHTHALMIC | 1 refills | Status: DC
  Filled 2021-01-02: qty 10, 50d supply, fill #0

## 2021-01-02 MED ORDER — MOXIFLOXACIN HCL 0.5 % OP SOLN
1.0000 [drp] | Freq: Four times a day (QID) | OPHTHALMIC | 1 refills | Status: DC
  Filled 2021-01-02: qty 3, 15d supply, fill #0

## 2021-01-05 ENCOUNTER — Other Ambulatory Visit (HOSPITAL_COMMUNITY): Payer: Self-pay

## 2021-01-05 MED ORDER — PREDNISOLONE ACETATE 1 % OP SUSP: 1.0000 [drp] | Freq: Four times a day (QID) | OPHTHALMIC | 0 refills | Status: DC

## 2021-01-08 DIAGNOSIS — H2511 Age-related nuclear cataract, right eye: Secondary | ICD-10-CM | POA: Diagnosis not present

## 2021-01-08 DIAGNOSIS — H25011 Cortical age-related cataract, right eye: Secondary | ICD-10-CM | POA: Diagnosis not present

## 2021-01-08 LAB — HM DIABETES EYE EXAM

## 2021-02-25 ENCOUNTER — Encounter: Payer: Self-pay | Admitting: Nurse Practitioner

## 2021-02-25 ENCOUNTER — Ambulatory Visit (INDEPENDENT_AMBULATORY_CARE_PROVIDER_SITE_OTHER): Payer: 59 | Admitting: Nurse Practitioner

## 2021-02-25 ENCOUNTER — Telehealth: Payer: Self-pay | Admitting: Nurse Practitioner

## 2021-02-25 ENCOUNTER — Other Ambulatory Visit: Payer: Self-pay

## 2021-02-25 VITALS — BP 118/70 | HR 75 | Temp 98.2°F | Resp 18 | Wt 195.2 lb

## 2021-02-25 DIAGNOSIS — E1165 Type 2 diabetes mellitus with hyperglycemia: Secondary | ICD-10-CM

## 2021-02-25 DIAGNOSIS — Z13220 Encounter for screening for lipoid disorders: Secondary | ICD-10-CM | POA: Diagnosis not present

## 2021-02-25 DIAGNOSIS — B354 Tinea corporis: Secondary | ICD-10-CM | POA: Diagnosis not present

## 2021-02-25 DIAGNOSIS — Z136 Encounter for screening for cardiovascular disorders: Secondary | ICD-10-CM

## 2021-02-25 DIAGNOSIS — Z0001 Encounter for general adult medical examination with abnormal findings: Secondary | ICD-10-CM

## 2021-02-25 LAB — HEMOGLOBIN A1C: Hgb A1c MFr Bld: 7.2 % — ABNORMAL HIGH (ref 4.6–6.5)

## 2021-02-25 LAB — COMPREHENSIVE METABOLIC PANEL
ALT: 37 U/L (ref 0–53)
AST: 24 U/L (ref 0–37)
Albumin: 4.9 g/dL (ref 3.5–5.2)
Alkaline Phosphatase: 61 U/L (ref 39–117)
BUN: 13 mg/dL (ref 6–23)
CO2: 27 mEq/L (ref 19–32)
Calcium: 9.7 mg/dL (ref 8.4–10.5)
Chloride: 103 mEq/L (ref 96–112)
Creatinine, Ser: 0.9 mg/dL (ref 0.40–1.50)
GFR: 116.73 mL/min (ref >60.00)
Glucose, Bld: 135 mg/dL — ABNORMAL HIGH (ref 70–99)
Potassium: 4.1 mEq/L (ref 3.5–5.1)
Sodium: 139 mEq/L (ref 135–145)
Total Bilirubin: 0.7 mg/dL (ref 0.2–1.2)
Total Protein: 7.6 g/dL (ref 6.0–8.3)

## 2021-02-25 LAB — LIPID PANEL
Cholesterol: 89 mg/dL (ref 0–200)
HDL: 44.5 mg/dL (ref >39.00)
LDL Cholesterol: 37 mg/dL (ref 0–99)
NonHDL: 44.51
Total CHOL/HDL Ratio: 2
Triglycerides: 36 mg/dL (ref 0.0–149.0)
VLDL: 7.2 mg/dL (ref 0.0–40.0)

## 2021-02-25 MED ORDER — METFORMIN HCL ER 500 MG PO TB24
500.0000 mg | ORAL_TABLET | Freq: Every day | ORAL | 1 refills | Status: AC
  Filled 2021-06-17 – 2021-07-29 (×2): qty 90, 90d supply, fill #0
  Filled 2021-11-02: qty 90, 90d supply, fill #1

## 2021-02-25 MED ORDER — GLIPIZIDE ER 5 MG PO TB24: 5.0000 mg | ORAL_TABLET | Freq: Every day | ORAL | 1 refills | Status: DC

## 2021-02-25 MED ORDER — KETOCONAZOLE 2 % EX CREA
1.0000 | TOPICAL_CREAM | Freq: Every day | CUTANEOUS | 0 refills | Status: AC
Start: 2021-02-25 — End: ?

## 2021-02-25 NOTE — Assessment & Plan Note (Addendum)
controlled Fasting glucose: 88-142. Completed DM eye exam by [facility] ophthalmology. Report requested Neuropathy stable with use of gabapentin.  Maintain metformin and glipizide dose Repeat hgbA1c and CMP

## 2021-02-25 NOTE — Patient Instructions (Addendum)
Sign medical release to get records from [facility] ophthalmology  Go to lab for blood draw  Start regular exercise 3-4x/week, moderate intensity exercise  Preventive Care 28-28 Years Old, Male Preventive care refers to lifestyle choices and visits with your health care provider that can promote health and wellness. Preventive care visits are also called wellness exams. What can I expect for my preventive care visit? Counseling During your preventive care visit, your health care provider may ask about your: Medical history, including: Past medical problems. Family medical history. Current health, including: Emotional well-being. Home life and relationship well-being. Sexual activity. Lifestyle, including: Alcohol, nicotine or tobacco, and drug use. Access to firearms. Diet, exercise, and sleep habits. Safety issues such as seatbelt and bike helmet use. Sunscreen use. Work and work Astronomer. Physical exam Your health care provider may check your: Height and weight. These may be used to calculate your BMI (body mass index). BMI is a measurement that tells if you are at a healthy weight. Waist circumference. This measures the distance around your waistline. This measurement also tells if you are at a healthy weight and may help predict your risk of certain diseases, such as type 2 diabetes and high blood pressure. Heart rate and blood pressure. Body temperature. Skin for abnormal spots. What immunizations do I need? Vaccines are usually given at various ages, according to a schedule. Your health care provider will recommend vaccines for you based on your age, medical history, and lifestyle or other factors, such as travel or where you work. What tests do I need? Screening Your health care provider may recommend screening tests for certain conditions. This may include: Lipid and cholesterol levels. Diabetes screening. This is done by checking your blood sugar (glucose) after you  have not eaten for a while (fasting). Hepatitis B test. Hepatitis C test. HIV (human immunodeficiency virus) test. STI (sexually transmitted infection) testing, if you are at risk. Talk with your health care provider about your test results, treatment options, and if necessary, the need for more tests. Follow these instructions at home: Eating and drinking  Eat a healthy diet that includes fresh fruits and vegetables, whole grains, lean protein, and low-fat dairy products. Drink enough fluid to keep your urine pale yellow. Take vitamin and mineral supplements as recommended by your health care provider. Do not drink alcohol if your health care provider tells you not to drink. If you drink alcohol: Limit how much you have to 0-2 drinks a day. Know how much alcohol is in your drink. In the [place] one drink equals one 12 oz bottle of beer (355 mL), one 5 oz glass of wine (148 mL), or one 1 oz glass of hard liquor (44 mL). Lifestyle Brush your teeth every morning and night with fluoride toothpaste. Floss one time each day. Exercise for at least 30 minutes 5 or more days each week. Do not use any products that contain nicotine or tobacco. These products include cigarettes, chewing tobacco, and vaping devices, such as e-cigarettes. If you need help quitting, ask your health care provider. Do not use drugs. If you are sexually active, practice safe sex. Use a condom or other form of protection to prevent STIs. Find healthy ways to manage stress, such as: Meditation, yoga, or listening to music. Journaling. Talking to a trusted person. Spending time with friends and family. Minimize exposure to UV radiation to reduce your risk of skin cancer. Safety Always wear your seat belt while driving or riding in a vehicle. Do not  drive: If you have been drinking alcohol. Do not ride with someone who has been drinking. If you have been using any mind-altering substances or drugs. While texting. When  you are tired or distracted. Wear a helmet and other protective equipment during sports activities. If you have firearms in your house, make sure you follow all gun safety procedures. Seek help if you have been physically or sexually abused. What's next? Go to your health care provider once a year for an annual wellness visit. Ask your health care provider how often you should have your eyes and teeth checked. Stay up to date on all vaccines. This information is not intended to replace advice given to you by your health care provider. Make sure you discuss any questions you have with your health care provider. Document Revised: 09/17/2020 Document Reviewed: 09/17/2020 Elsevier Patient Education  2022 [employer].

## 2021-02-25 NOTE — Progress Notes (Signed)
Subjective:    Patient ID: Erik Ford, male    DOB: [birth year], 30 y.o.   MRN: [id number]  Patient presents today for CPE and eval of chronic condition  Rash This is a recurrent problem. The current episode started more than 1 month ago. The problem is unchanged. The affected locations include the right buttock. The rash is characterized by dryness, scaling and itchiness. He was exposed to nothing. Pertinent negatives include no congestion, cough, diarrhea, fever, joint pain, nail changes, shortness of breath or sore throat. Past treatments include nothing. There is no history of allergies or eczema.   Type 2 diabetes mellitus with hyperglycemia, without long-term current use of insulin (HCC) controlled Fasting glucose: 88-142. Completed DM eye exam by [facility] ophthalmology. Report requested Neuropathy stable with use of gabapentin.  Maintain metformin and glipizide dose Repeat hgbA1c and CMP  Vision:up to date Dental:will schedule Diet:low carb/low fat Exercise:none Weight:  Wt Readings from Last 3 Encounters:  02/25/21 195 lb 3.2 oz (88.5 kg)  11/24/20 191 lb 6.4 oz (86.8 kg)  09/22/20 188 lb 6.4 oz (85.5 kg)    Sexual History (orientation,birth control, marital status, STD):married, sexually active, no need for STD screen  Depression/Suicide: Depression screen [facility] 2/9 11/24/2020  Decreased Interest 1  Down, Depressed, Hopeless 0  PHQ - 2 Score 1  Altered sleeping 0  Tired, decreased energy 1  Change in appetite 0  Feeling bad or failure about yourself  0  Trouble concentrating 0  Moving slowly or fidgety/restless 0  Suicidal thoughts 0  PHQ-9 Score 2  Difficult doing work/chores Not difficult at all   Immunizations: (TDAP, Hep C screen, Pneumovax, Influenza, zoster)  Health Maintenance  Topic Date Due   Eye exam for diabetics  Never done   COVID-19 Vaccine (3 - Booster for Pfizer series) 01/15/2020   Hepatitis C Screening: USPSTF Recommendation to screen  - Ages 18-79 yo.  02/25/2022*   HIV Screening  02/25/2022*   Hemoglobin A1C  05/27/2021   Complete foot exam   08/21/2021   Urine Protein Check  08/21/2021   Pneumococcal Vaccination (2 - PCV) 09/22/2021   Tetanus Vaccine  08/18/2022   Flu Shot  Completed   HPV Vaccine  Aged Out  *Topic was postponed. The date shown is not the original due date.   Fall Risk: Fall Risk  02/25/2021 09/22/2020 08/21/2020  Falls in the past year? 0 0 0  Number falls in past yr: 0 0 0  Injury with Fall? 0 0 0  Risk for fall due to : - - No Fall Risks  Follow up - - Falls evaluation completed   Medications and allergies reviewed with patient and updated if appropriate.  Patient Active Problem List   Diagnosis Date Noted   Type 2 diabetes mellitus with hyperglycemia, without long-term current use of insulin ([place]) 08/21/2020   Diabetic neuropathy ([place]) 08/21/2020   Other specified depressive episodes 08/21/2020    Current Outpatient Medications on File Prior to Visit  Medication Sig Dispense Refill   blood glucose meter kit and supplies KIT Dispense based on patient and insurance preference. Use once a day before breakfast. ICD10 E11.69 1 each 1   FREESTYLE LITE test strip daily.     gabapentin (NEURONTIN) 100 MG capsule Take 2 capsules (200 mg total) by mouth at bedtime. 180 capsule 3   glipiZIDE (GLUCOTROL XL) 5 MG 24 hr tablet Take 1 tablet (5 mg total) by mouth daily with supper. 90 tablet 1  glucose blood (FREESTYLE LITE) test strip Use to test blood sugar once daily 50 each 1   Lancets (FREESTYLE) lancets daily.     Lancets (FREESTYLE) lancets Use to test blood glucose once daily 100 each 1   metFORMIN (GLUCOPHAGE XR) 500 MG 24 hr tablet Take 1 tablet (500 mg total) by mouth daily with breakfast. 90 tablet 1   No current facility-administered medications on file prior to visit.    History reviewed. No pertinent past medical history.  Past Surgical History:  Procedure Laterality Date    CATARACT EXTRACTION, BILATERAL Bilateral 12/09/2020    Social History   Socioeconomic History   Marital status: Married    Spouse name: Not on file   Number of children: Not on file   Years of education: Not on file   Highest education level: Not on file  Occupational History    Comment: [employer]  Tobacco Use   Smoking status: Never   Smokeless tobacco: Never  Vaping Use   Vaping Use: Never used  Substance and Sexual Activity   Alcohol use: No   Drug use: No   Sexual activity: Yes    Birth control/protection: None  Other Topics Concern   Not on file  Social History Narrative   Not on file   Social Determinants of Health   Financial Resource Strain: Not on file  Food Insecurity: Not on file  Transportation Needs: Not on file  Physical Activity: Not on file  Stress: Not on file  Social Connections: Not on file    Family History  Problem Relation Age of Onset   Diabetes Paternal Grandfather         Review of Systems  Constitutional:  Negative for fever, malaise/fatigue and weight loss.  HENT:  Negative for congestion and sore throat.   Eyes:        Negative for visual changes  Respiratory:  Negative for cough and shortness of breath.   Cardiovascular:  Negative for chest pain, palpitations and leg swelling.  Gastrointestinal:  Negative for blood in stool, constipation, diarrhea and heartburn.  Genitourinary:  Negative for dysuria, frequency and urgency.  Musculoskeletal:  Negative for falls, joint pain and myalgias.  Skin:  Positive for rash. Negative for nail changes.  Neurological:  Negative for dizziness, sensory change and headaches.  Endo/Heme/Allergies:  Does not bruise/bleed easily.  Psychiatric/Behavioral:  Negative for depression, substance abuse and suicidal ideas. The patient is not nervous/anxious and does not have insomnia.    Objective:   Vitals:   02/25/21 1128  BP: 118/70  Pulse: 75  Resp: 18  Temp: 98.2 F (36.8 C)  SpO2: 98%    Body  mass index is 31.51 kg/m.   Physical Examination:  Physical Exam Vitals reviewed.  Constitutional:      General: He is not in acute distress.    Appearance: He is well-developed. He is obese.  HENT:     Right Ear: Tympanic membrane, ear canal and external ear normal.     Left Ear: Tympanic membrane, ear canal and external ear normal.  Eyes:     Extraocular Movements: Extraocular movements intact.     Conjunctiva/sclera: Conjunctivae normal.  Cardiovascular:     Rate and Rhythm: Normal rate and regular rhythm.     Pulses: Normal pulses.     Heart sounds: Normal heart sounds.  Pulmonary:     Effort: Pulmonary effort is normal. No respiratory distress.     Breath sounds: Normal breath sounds.  Chest:  Chest wall: No tenderness.  Abdominal:     General: Bowel sounds are normal.     Palpations: Abdomen is soft.  Musculoskeletal:        General: Normal range of motion.     Cervical back: Normal range of motion and neck supple.     Right lower leg: No edema.     Left lower leg: No edema.  Skin:    General: Skin is warm and dry.     Findings: Rash present. No erythema. Rash is macular and scaling.       Neurological:     Mental Status: He is alert and oriented to person, place, and time.     Deep Tendon Reflexes: Reflexes are normal and symmetric.  Psychiatric:        Mood and Affect: Mood normal.        Behavior: Behavior normal.        Thought Content: Thought content normal.    ASSESSMENT and PLAN: This visit occurred during the SARS-CoV-2 public health emergency.  Safety protocols were in place, including screening questions prior to the visit, additional usage of staff PPE, and extensive cleaning of exam room while observing appropriate contact time as indicated for disinfecting solutions.   Kevonte was seen today for annual exam.  Diagnoses and all orders for this visit:  Encounter for preventative adult health care exam with abnormal findings -      Comprehensive metabolic panel -     Lipid panel -     Hemoglobin A1c  Type 2 diabetes mellitus with hyperglycemia, without long-term current use of insulin (HCC) -     Hemoglobin A1c  Encounter for lipid screening for cardiovascular disease -     Lipid panel  Tinea corporis -     ketoconazole (NIZORAL) 2 % cream; Apply 1 application topically daily.     Problem List Items Addressed This Visit       Endocrine   Type 2 diabetes mellitus with hyperglycemia, without long-term current use of insulin ([place])    controlled Fasting glucose: 88-142. Completed DM eye exam by [facility] ophthalmology. Report requested Neuropathy stable with use of gabapentin.  Maintain metformin and glipizide dose Repeat hgbA1c and CMP      Relevant Orders   Hemoglobin A1c   Other Visit Diagnoses     Encounter for preventative adult health care exam with abnormal findings    -  Primary   Relevant Orders   Comprehensive metabolic panel   Lipid panel   Hemoglobin A1c   Encounter for lipid screening for cardiovascular disease       Relevant Orders   Lipid panel   Tinea corporis       Relevant Medications   ketoconazole (NIZORAL) 2 % cream       Follow up: Return in about 6 months (around 08/25/2021) for DM.  Wilfred Lacy, NP

## 2021-03-01 ENCOUNTER — Other Ambulatory Visit: Payer: Self-pay | Admitting: Nurse Practitioner

## 2021-03-01 DIAGNOSIS — E1165 Type 2 diabetes mellitus with hyperglycemia: Secondary | ICD-10-CM

## 2021-03-01 MED ORDER — GLIPIZIDE ER 5 MG PO TB24: 5.0000 mg | ORAL_TABLET | Freq: Every day | ORAL | 1 refills | Status: DC

## 2021-04-06 DIAGNOSIS — Z03818 Encounter for observation for suspected exposure to other biological agents ruled out: Secondary | ICD-10-CM | POA: Diagnosis not present

## 2021-04-06 DIAGNOSIS — Z20822 Contact with and (suspected) exposure to covid-19: Secondary | ICD-10-CM | POA: Diagnosis not present

## 2021-04-17 ENCOUNTER — Other Ambulatory Visit: Payer: Self-pay | Admitting: Nurse Practitioner

## 2021-04-17 ENCOUNTER — Other Ambulatory Visit (HOSPITAL_COMMUNITY): Payer: Self-pay

## 2021-04-17 DIAGNOSIS — E1165 Type 2 diabetes mellitus with hyperglycemia: Secondary | ICD-10-CM

## 2021-04-20 ENCOUNTER — Other Ambulatory Visit (HOSPITAL_COMMUNITY): Payer: Self-pay

## 2021-04-20 MED ORDER — GLIPIZIDE ER 5 MG PO TB24
5.0000 mg | ORAL_TABLET | Freq: Every day | ORAL | 0 refills | Status: DC
  Filled 2021-04-20 – 2021-04-30 (×2): qty 90, 90d supply, fill #0

## 2021-04-28 ENCOUNTER — Other Ambulatory Visit (HOSPITAL_COMMUNITY): Payer: Self-pay

## 2021-04-30 ENCOUNTER — Other Ambulatory Visit (HOSPITAL_COMMUNITY): Payer: Self-pay

## 2021-06-11 ENCOUNTER — Other Ambulatory Visit: Payer: Self-pay | Admitting: Nurse Practitioner

## 2021-06-11 ENCOUNTER — Other Ambulatory Visit (HOSPITAL_COMMUNITY): Payer: Self-pay

## 2021-06-11 DIAGNOSIS — E1165 Type 2 diabetes mellitus with hyperglycemia: Secondary | ICD-10-CM

## 2021-06-17 ENCOUNTER — Other Ambulatory Visit (HOSPITAL_COMMUNITY): Payer: Self-pay

## 2021-06-18 ENCOUNTER — Telehealth: Payer: Self-pay | Admitting: Nurse Practitioner

## 2021-06-18 ENCOUNTER — Other Ambulatory Visit (HOSPITAL_COMMUNITY): Payer: Self-pay

## 2021-06-18 MED ORDER — METFORMIN HCL ER 500 MG PO TB24: 500.0000 mg | ORAL_TABLET | Freq: Every day | ORAL | 1 refills | Status: AC

## 2021-06-18 NOTE — Telephone Encounter (Signed)
Pt is requesting a refill on his metformin at [facility]. He stated the pharmacy said it had been denied. He would like to know why? Please advise. ?

## 2021-06-18 NOTE — Telephone Encounter (Signed)
Chart supports Rx Medication sent in.  

## 2021-07-29 ENCOUNTER — Other Ambulatory Visit (HOSPITAL_COMMUNITY): Payer: Self-pay

## 2021-07-29 ENCOUNTER — Other Ambulatory Visit: Payer: Self-pay | Admitting: Nurse Practitioner

## 2021-07-29 DIAGNOSIS — E1165 Type 2 diabetes mellitus with hyperglycemia: Secondary | ICD-10-CM

## 2021-07-31 ENCOUNTER — Other Ambulatory Visit (HOSPITAL_COMMUNITY): Payer: Self-pay

## 2021-08-04 ENCOUNTER — Other Ambulatory Visit (HOSPITAL_COMMUNITY): Payer: Self-pay

## 2021-08-04 MED ORDER — GLIPIZIDE ER 5 MG PO TB24
5.0000 mg | ORAL_TABLET | Freq: Every day | ORAL | 0 refills | Status: DC
  Filled 2021-08-04: qty 90, 90d supply, fill #0

## 2021-08-04 NOTE — Telephone Encounter (Signed)
Chart supports rx refill ?Last ov: 02/15/21 ?Last refill: 04/30/21 ? ?

## 2021-09-10 ENCOUNTER — Encounter: Payer: Self-pay | Admitting: Podiatry

## 2021-09-10 ENCOUNTER — Ambulatory Visit (INDEPENDENT_AMBULATORY_CARE_PROVIDER_SITE_OTHER): Payer: 59 | Admitting: Podiatry

## 2021-09-10 DIAGNOSIS — L600 Ingrowing nail: Secondary | ICD-10-CM

## 2021-09-10 NOTE — Patient Instructions (Signed)

## 2021-09-10 NOTE — Progress Notes (Signed)
Subjective:   Patient ID: Erik Ford, male   DOB: 31 y.o.   MRN: [id number]   HPI Patient presents stating that he has a painful ingrown toenail on his left big toe and it has been very sore and making it hard to wear shoe gear comfortably.  States that he has tried to trim this out himself without relief   ROS      Objective:  Physical Exam  Vascular status intact incurvated left hallux medial border painful when pressed with slight redness of the overall nailbed slight looseness to the nailbed itself with patient who is a diabetic but under good control with his last A1c approximate 7     Assessment:  Probability for trauma of the left hallux nail with an ingrown medial component with moderate looseness of the bed     Plan:  H&P reviewed removal of the nail corner which I think would be best for him long-term and he wants to have this done and I explained procedure risk he is willing to accept the risk of surgery and at this point I went ahead and I allowed him to read then signed consent form.  I did discuss ultimately the rest of the nail may come off and ultimately may require other work but we will just try to work on the corner and hope the rest the nail is stable.  I infiltrated the left hallux 60 mg like Marcaine mixture sterile prep done and using sterile instrumentation remove the medial border exposed matrix applied phenol 3 applications 30 seconds followed by alcohol lavage sterile dressing gave instructions on soaks and to wear dressing for 24 hours and take it off earlier if throbbing were to occur

## 2021-11-03 ENCOUNTER — Other Ambulatory Visit (HOSPITAL_COMMUNITY): Payer: Self-pay

## 2021-11-27 NOTE — Telephone Encounter (Signed)
error 

## 2021-12-22 ENCOUNTER — Other Ambulatory Visit: Payer: Self-pay | Admitting: Nurse Practitioner

## 2021-12-22 DIAGNOSIS — E1141 Type 2 diabetes mellitus with diabetic mononeuropathy: Secondary | ICD-10-CM

## 2021-12-22 DIAGNOSIS — E1165 Type 2 diabetes mellitus with hyperglycemia: Secondary | ICD-10-CM

## 2021-12-22 NOTE — Telephone Encounter (Signed)
Chart supports Rx Last OV: 02/2022 Next OV: not scheduled   Okay to fill ?

## 2021-12-23 ENCOUNTER — Other Ambulatory Visit (HOSPITAL_COMMUNITY): Payer: Self-pay

## 2021-12-23 MED ORDER — GLIPIZIDE ER 5 MG PO TB24
5.0000 mg | ORAL_TABLET | Freq: Every day | ORAL | 0 refills | Status: AC
  Filled 2021-12-23: qty 30, 30d supply, fill #0

## 2021-12-23 MED ORDER — GABAPENTIN 100 MG PO CAPS
200.0000 mg | ORAL_CAPSULE | Freq: Every day | ORAL | 0 refills | Status: AC
  Filled 2021-12-23: qty 60, 30d supply, fill #0

## 2021-12-29 ENCOUNTER — Other Ambulatory Visit (HOSPITAL_COMMUNITY): Payer: Self-pay

## 2022-05-17 ENCOUNTER — Telehealth: Payer: 59 | Admitting: Emergency Medicine

## 2022-05-17 DIAGNOSIS — J189 Pneumonia, unspecified organism: Secondary | ICD-10-CM | POA: Diagnosis not present

## 2022-05-17 MED ORDER — BENZONATATE 100 MG PO CAPS: 100.0000 mg | ORAL_CAPSULE | Freq: Two times a day (BID) | ORAL | 0 refills | Status: AC | PRN

## 2022-05-17 MED ORDER — ALBUTEROL SULFATE HFA 108 (90 BASE) MCG/ACT IN AERS: 2.0000 | INHALATION_SPRAY | Freq: Four times a day (QID) | RESPIRATORY_TRACT | 0 refills | Status: AC | PRN

## 2022-05-17 MED ORDER — DOXYCYCLINE HYCLATE 100 MG PO TABS: 100.0000 mg | ORAL_TABLET | Freq: Two times a day (BID) | ORAL | 0 refills | Status: AC

## 2022-05-17 MED ORDER — SPACER/AERO-HOLDING CHAMBERS DEVI: 1.0000 | 0 refills | Status: AC | PRN

## 2022-05-17 NOTE — Patient Instructions (Signed)
Erik Ford, thank you for joining Carvel Getting, NP for today's virtual visit.  While this provider is not your primary care provider (PCP), if your PCP is located in our provider database this encounter information will be shared with them immediately following your visit.   [facility] account gives you access to today's visit and all your visits, tests, and labs performed at [facility] " click here if you don't have a [facility] account or go to [email or web address]  Consent: (Patient) Erik Ford provided verbal consent for this virtual visit at the beginning of the encounter.  Current Medications:  Current Outpatient Medications:    albuterol (VENTOLIN HFA) 108 (90 Base) MCG/ACT inhaler, Inhale 2 puffs into the lungs every 6 (six) hours as needed for wheezing or shortness of breath., Disp: 8 g, Rfl: 0   benzonatate (TESSALON) 100 MG capsule, Take 1 capsule (100 mg total) by mouth 2 (two) times daily as needed for cough., Disp: 20 capsule, Rfl: 0   doxycycline (VIBRA-TABS) 100 MG tablet, Take 1 tablet (100 mg total) by mouth 2 (two) times daily for 10 days., Disp: 20 tablet, Rfl: 0   Spacer/Aero-Holding Chambers DEVI, 1 each by Does not apply route as needed., Disp: 1 each, Rfl: 0   blood glucose meter kit and supplies KIT, Dispense based on patient and insurance preference. Use once a day before breakfast. ICD10 E11.69, Disp: 1 each, Rfl: 1   FREESTYLE LITE test strip, daily., Disp: , Rfl:    gabapentin (NEURONTIN) 100 MG capsule, Take 2 capsules (200 mg total) by mouth at bedtime. No additional refill without office visit, Disp: 60 capsule, Rfl: 0   glipiZIDE (GLIPIZIDE XL) 5 MG 24 hr tablet, Take 1 tablet (5 mg total) by mouth daily with supper. No additional refill without office visit, Disp: 30 tablet, Rfl: 0   glucose blood (FREESTYLE LITE) test strip, Use to test blood sugar once daily, Disp: 50 each, Rfl: 1   ketoconazole  (NIZORAL) 2 % cream, Apply 1 application topically daily., Disp: 30 g, Rfl: 0   Lancets (FREESTYLE) lancets, daily., Disp: , Rfl:    Lancets (FREESTYLE) lancets, Use to test blood glucose once daily, Disp: 100 each, Rfl: 1   metFORMIN (GLUCOPHAGE-XR) 500 MG 24 hr tablet, Take 1 tablet (500 mg total) by mouth daily with breakfast., Disp: 90 tablet, Rfl: 1   metFORMIN (GLUCOPHAGE-XR) 500 MG 24 hr tablet, Take 1 tablet (500 mg total) by mouth daily with breakfast., Disp: 90 tablet, Rfl: 1   Medications ordered in this encounter:  Meds ordered this encounter  Medications   benzonatate (TESSALON) 100 MG capsule    Sig: Take 1 capsule (100 mg total) by mouth 2 (two) times daily as needed for cough.    Dispense:  20 capsule    Refill:  0   albuterol (VENTOLIN HFA) 108 (90 Base) MCG/ACT inhaler    Sig: Inhale 2 puffs into the lungs every 6 (six) hours as needed for wheezing or shortness of breath.    Dispense:  8 g    Refill:  0   Spacer/Aero-Holding Chambers DEVI    Sig: 1 each by Does not apply route as needed.    Dispense:  1 each    Refill:  0   doxycycline (VIBRA-TABS) 100 MG tablet    Sig: Take 1 tablet (100 mg total) by mouth 2 (two) times daily for 10 days.    Dispense:  20 tablet  Refill:  0     *If you need refills on other medications prior to your next appointment, please contact your pharmacy*  Follow-Up: Call back or seek an in-person evaluation if the symptoms worsen or if the condition fails to improve as anticipated.  [facility] [phone]  Other Instructions Continue using Dayquil and Nyquil to help manage your symptoms. I recommend you also try Mucinex. Use the inhaler with the spacer (it helps the medicine get to your lungs instead of hitting the back of your throat) if you have shortness of breath, wheezing, or the chest tightness.    If you have been instructed to have an in-person evaluation today at a local Urgent Care facility, please use the  link below. It will take you to a list of all of our available [facility], including address, phone number and hours of operation. Please do not delay care.  [facility]  If you or a family member do not have a primary care provider, use the link below to schedule a visit and establish care. When you choose a [facility] primary care physician or advanced practice provider, you gain a long-term partner in health. Find a Primary Care Provider  Learn more about [facility]'s in-office and virtual care options: [facility]

## 2022-05-17 NOTE — Progress Notes (Signed)
Virtual Visit Consent   [place], you are scheduled for a virtual visit with a [facility] provider today. Just as with appointments in the office, your consent must be obtained to participate. Your consent will be active for this visit and any virtual visit you may have with one of our providers in the next 365 days. If you have a MyChart account, a copy of this consent can be sent to you electronically.  As this is a virtual visit, video technology does not allow for your provider to perform a traditional examination. This may limit your provider's ability to fully assess your condition. If your provider identifies any concerns that need to be evaluated in person or the need to arrange testing (such as labs, EKG, etc.), we will make arrangements to do so. Although advances in technology are sophisticated, we cannot ensure that it will always work on either your end or our end. If the connection with a video visit is poor, the visit may have to be switched to a telephone visit. With either a video or telephone visit, we are not always able to ensure that we have a secure connection.  By engaging in this virtual visit, you consent to the provision of healthcare and authorize for your insurance to be billed (if applicable) for the services provided during this visit. Depending on your insurance coverage, you may receive a charge related to this service.  I need to obtain your verbal consent now. Are you willing to proceed with your visit today? [name] has provided verbal consent on 05/17/2022 for a virtual visit (video or telephone). Carvel Getting, NP  Date: 05/17/2022 12:50 PM  Virtual Visit via Video Note   I, Carvel Getting, connected with  Erik Ford  ([id number], 09/23/1990) on 05/17/22 at 12:45 PM EST by a video-enabled telemedicine application and verified that I am speaking with the correct person using two identifiers.  Location: Patient: Virtual Visit  Location Patient: Home Provider: Virtual Visit Location Provider: Home Office   I discussed the limitations of evaluation and management by telemedicine and the availability of in person appointments. The patient expressed understanding and agreed to proceed.    History of Present Illness: Erik Ford is a 32 y.o. who identifies as a male who was assigned male at birth, and is being seen today for cough and sob. Has been sick for over a week. Wife was similarly sick and tested herself for covid - test was negative so pt did not test himself. Feels his chest is a little tight when he coughs and he is wheezing a little at night. Has a hx of ashtma "but I don't have asthma anymore." Coughing up green sputum. Does have some nasal congestion and post nasal drainage but feels most of what he coughs up si from his chest.   HPI: HPI  Problems:  Patient Active Problem List   Diagnosis Date Noted   Type 2 diabetes mellitus with hyperglycemia, without long-term current use of insulin ([place]) 08/21/2020   Diabetic neuropathy ([place]) 08/21/2020   Other specified depressive episodes 08/21/2020    Allergies: No Known Allergies Medications:  Current Outpatient Medications:    albuterol (VENTOLIN HFA) 108 (90 Base) MCG/ACT inhaler, Inhale 2 puffs into the lungs every 6 (six) hours as needed for wheezing or shortness of breath., Disp: 8 g, Rfl: 0   benzonatate (TESSALON) 100 MG capsule, Take 1 capsule (100 mg total) by mouth 2 (two) times daily  as needed for cough., Disp: 20 capsule, Rfl: 0   doxycycline (VIBRA-TABS) 100 MG tablet, Take 1 tablet (100 mg total) by mouth 2 (two) times daily for 10 days., Disp: 20 tablet, Rfl: 0   Spacer/Aero-Holding Chambers DEVI, 1 each by Does not apply route as needed., Disp: 1 each, Rfl: 0   blood glucose meter kit and supplies KIT, Dispense based on patient and insurance preference. Use once a day before breakfast. ICD10 E11.69, Disp: 1 each, Rfl: 1   FREESTYLE LITE  test strip, daily., Disp: , Rfl:    gabapentin (NEURONTIN) 100 MG capsule, Take 2 capsules (200 mg total) by mouth at bedtime. No additional refill without office visit, Disp: 60 capsule, Rfl: 0   glipiZIDE (GLIPIZIDE XL) 5 MG 24 hr tablet, Take 1 tablet (5 mg total) by mouth daily with supper. No additional refill without office visit, Disp: 30 tablet, Rfl: 0   glucose blood (FREESTYLE LITE) test strip, Use to test blood sugar once daily, Disp: 50 each, Rfl: 1   ketoconazole (NIZORAL) 2 % cream, Apply 1 application topically daily., Disp: 30 g, Rfl: 0   Lancets (FREESTYLE) lancets, daily., Disp: , Rfl:    Lancets (FREESTYLE) lancets, Use to test blood glucose once daily, Disp: 100 each, Rfl: 1   metFORMIN (GLUCOPHAGE-XR) 500 MG 24 hr tablet, Take 1 tablet (500 mg total) by mouth daily with breakfast., Disp: 90 tablet, Rfl: 1   metFORMIN (GLUCOPHAGE-XR) 500 MG 24 hr tablet, Take 1 tablet (500 mg total) by mouth daily with breakfast., Disp: 90 tablet, Rfl: 1  Observations/Objective: Patient is well-developed, well-nourished in no acute distress.  Resting comfortably  at home.  Head is normocephalic, atraumatic.  No labored breathing.  Speech is clear and coherent with logical content.  Patient is alert and oriented at baseline.    Assessment and Plan: 1. Community acquired pneumonia, unspecified laterality  May have CAP. RX inhaler given report of wheezing.   Follow Up Instructions: I discussed the assessment and treatment plan with the patient. The patient was provided an opportunity to ask questions and all were answered. The patient agreed with the plan and demonstrated an understanding of the instructions.  A copy of instructions were sent to the patient via MyChart unless otherwise noted below.   The patient was advised to call back or seek an in-person evaluation if the symptoms worsen or if the condition fails to improve as anticipated.  Time:  I spent 12 minutes with the patient  via telehealth technology discussing the above problems/concerns.    Carvel Getting, NP

## 2022-07-29 DIAGNOSIS — F32A Depression, unspecified: Secondary | ICD-10-CM | POA: Diagnosis not present

## 2022-07-29 DIAGNOSIS — F4310 Post-traumatic stress disorder, unspecified: Secondary | ICD-10-CM | POA: Diagnosis not present

## 2022-07-29 DIAGNOSIS — E119 Type 2 diabetes mellitus without complications: Secondary | ICD-10-CM | POA: Diagnosis not present

## 2022-08-25 DIAGNOSIS — E119 Type 2 diabetes mellitus without complications: Secondary | ICD-10-CM | POA: Diagnosis not present

## 2022-08-25 DIAGNOSIS — E1165 Type 2 diabetes mellitus with hyperglycemia: Secondary | ICD-10-CM | POA: Diagnosis not present

## 2022-08-25 DIAGNOSIS — Z794 Long term (current) use of insulin: Secondary | ICD-10-CM | POA: Diagnosis not present

## 2022-08-25 DIAGNOSIS — F4310 Post-traumatic stress disorder, unspecified: Secondary | ICD-10-CM | POA: Diagnosis not present

## 2022-08-25 DIAGNOSIS — F32A Depression, unspecified: Secondary | ICD-10-CM | POA: Diagnosis not present

## 2022-10-08 DIAGNOSIS — Z794 Long term (current) use of insulin: Secondary | ICD-10-CM | POA: Diagnosis not present

## 2022-10-08 DIAGNOSIS — E1140 Type 2 diabetes mellitus with diabetic neuropathy, unspecified: Secondary | ICD-10-CM | POA: Diagnosis not present

## 2022-10-08 DIAGNOSIS — L600 Ingrowing nail: Secondary | ICD-10-CM | POA: Diagnosis not present

## 2022-10-08 DIAGNOSIS — F4310 Post-traumatic stress disorder, unspecified: Secondary | ICD-10-CM | POA: Diagnosis not present

## 2022-11-02 DIAGNOSIS — E119 Type 2 diabetes mellitus without complications: Secondary | ICD-10-CM | POA: Diagnosis not present

## 2022-11-19 ENCOUNTER — Encounter: Payer: Self-pay | Admitting: Dietician

## 2022-11-19 ENCOUNTER — Encounter: Payer: 59 | Attending: Family Medicine | Admitting: Dietician

## 2022-11-19 VITALS — Ht 67.0 in | Wt 196.0 lb

## 2022-11-19 DIAGNOSIS — E1165 Type 2 diabetes mellitus with hyperglycemia: Secondary | ICD-10-CM | POA: Diagnosis not present

## 2022-11-19 NOTE — Progress Notes (Signed)
Diabetes Self-Management Education  Visit Type: First/Initial  Appt. Start Time: 1045 Appt. End Time: 1200  11/19/2022  [name], identified by name and date of birth, is a 32 y.o. male with a diagnosis of Diabetes: Type 2.   ASSESSMENT  Patient is here today alone.  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  Patient lives with his mother and step-father.  His step-father does the shopping and cooking.  Not a lot of salt and little frying at home.  His mother also has diabetes. Patient works for [employer] in [employer]. 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 Height 5\' 7"  (1.702 m), weight 196 lb (88.9 kg). Body mass index is 30.7 kg/m.   Diabetes Self-Management Education - 11/19/22 1100       Visit Information   Visit Type First/Initial      Initial Visit   Diabetes Type Type 2    Date Diagnosed 2022    Are you currently following a meal plan? No    Are you taking your medications as prescribed? Yes      Health Coping   How would you rate your overall health? Good      Psychosocial Assessment   Patient Belief/Attitude about Diabetes Motivated to manage diabetes    What is the hardest part about your diabetes right now, causing you the most concern, or is the most worrisome to you about your diabetes?   Taking/obtaining medications;Making healty food and beverage choices;Being active    Self-care barriers None    Self-management support Doctor's office    Other persons present Patient    Patient Concerns Nutrition/Meal planning;Weight Control    Special Needs None    Preferred Learning Style No preference indicated    Learning Readiness Ready    How  often do you need to have someone help you when you read instructions, pamphlets, or other written materials from your doctor or pharmacy? 1 - Never    What is the last grade level you completed in school? Associate's degree      Pre-Education Assessment   Patient understands the diabetes disease and treatment process. Needs Instruction    Patient understands incorporating nutritional management into lifestyle. Needs Instruction    Patient undertands incorporating physical activity into lifestyle. Needs Instruction    Patient understands using medications safely. Needs Instruction    Patient understands monitoring blood glucose, interpreting and using results Needs Instruction    Patient understands prevention, detection, and treatment of acute complications. Needs Instruction    Patient understands prevention, detection, and treatment of chronic complications. Needs Instruction    Patient understands how to develop strategies to address psychosocial issues. Needs Instruction    Patient understands how to develop strategies to promote health/change behavior. Needs Instruction      Complications   Last HgB A1C per patient/outside source 10.1 %   07/30/2022   How often do you check your blood sugar? 1-2 times/day    Fasting Blood glucose range (mg/dL) >875;643-329    Postprandial Blood glucose range (mg/dL) >518    Number of hypoglycemic episodes per month 0    Number of hyperglycemic episodes ( >200mg /dL): Daily  Can you tell when your blood sugar is high? Yes    What do you do if your blood sugar is high? rest    Have you had a dilated eye exam in the past 12 months? No    Have you had a dental exam in the past 12 months? No    Are you checking your feet? Yes    How many days per week are you checking your feet? 7      Dietary Intake   Breakfast fruit and regular yogurt    Snack (morning) none    Lunch baked chicken tenders, BBQ sauce    Snack (afternoon) occasional    Dinner  chicken, broccoli, alredo pasta, 1/2 bread stick, salad with Olive Oil dressing, Sangria drink    Snack (evening) none    Beverage(s) water, zero sugar soda, zero sugar gatorade, rare alcohol      Activity / Exercise   Activity / Exercise Type Light (walking / raking leaves)    How many days per week do you exercise? 5    How many minutes per day do you exercise? 60    Total minutes per week of exercise 300      Patient Education   Previous Diabetes Education No    Disease Pathophysiology Definition of diabetes, type 1 and 2, and the diagnosis of diabetes;Explored patient's options for treatment of their diabetes    Healthy Eating Role of diet in the treatment of diabetes and the relationship between the three main macronutrients and blood glucose level;Food label reading, portion sizes and measuring food.;Plate Method;Meal options for control of blood glucose level and chronic complications.;Effects of alcohol on blood glucose and safety factors with consumption of alcohol.;Reviewed blood glucose goals for pre and post meals and how to evaluate the patients' food intake on their blood glucose level.    Being Active Role of exercise on diabetes management, blood pressure control and cardiac health.    Monitoring Taught/discussed recording of test results and interpretation of SMBG.;Identified appropriate SMBG and/or A1C goals.;Daily foot exams;Yearly dilated eye exam    Acute complications Taught prevention, symptoms, and  treatment of hypoglycemia - the 15 rule.;Discussed and identified patients' prevention, symptoms, and treatment of hyperglycemia.    Chronic complications Relationship between chronic complications and blood glucose control;Retinopathy and reason for yearly dilated eye exams    Diabetes Stress and Support Identified and addressed patients feelings and concerns about diabetes;Worked with patient to identify barriers to care and solutions;Role of stress on diabetes       Individualized Goals (developed by patient)   Nutrition General guidelines for healthy choices and portions discussed    Physical Activity Exercise 3-5 times per week;60 minutes per day    Medications take my medication as prescribed    Monitoring  Test my blood glucose as discussed    Problem Solving Eating Pattern;Addressing barriers to behavior change    Reducing Risk examine blood glucose patterns;do foot checks daily;treat hypoglycemia with 15 grams of carbs if blood glucose less than 70mg /dL      Post-Education Assessment   Patient understands the diabetes disease and treatment process. Comprehends key points    Patient understands incorporating nutritional management into lifestyle. Comprehends key points    Patient undertands incorporating physical activity into lifestyle. Comprehends key points    Patient understands using medications safely. Comphrehends key points    Patient understands monitoring blood glucose, interpreting and using results Comprehends key points    Patient understands prevention,  detection, and treatment of acute complications. Comprehends key points    Patient understands prevention, detection, and treatment of chronic complications. Comprehends key points    Patient understands how to develop strategies to address psychosocial issues. Comprehends key points    Patient understands how to develop strategies to promote health/change behavior. Comprehends key points      Outcomes   Expected Outcomes Demonstrated interest in learning. Expect positive outcomes    Future DMSE PRN    Program Status Completed             Individualized Plan for Diabetes Self-Management Training:   Learning Objective:  Patient will have a greater understanding of diabetes self-management. Patient education plan is to attend individual and/or group sessions per assessed needs and concerns.   Plan:   Patient Instructions  Rotate your insulin injection sites  Plan:  Aim  for 3-4 Carb Choices per meal (45-60 grams) +/- 1 either way  Aim for 0-1 Carbs per snack if hungry  Include protein in moderation with your meals and snacks Consider reading food labels for Total Carbohydrate of foods Consider  increasing your activity level by walking for 60 minutes daily as tolerated Consider checking BG at alternate times per day  Consider taking medication as directed by MD   Yearly eye appointment- please make an appointment for a diabetes eye exam- This clinic or one of your choice: [facility] [phone]  Expected Outcomes:  Demonstrated interest in learning. Expect positive outcomes  Education material provided: ADA - How to Thrive: A Guide for Your Journey with Diabetes, Food label handouts, Meal plan card, Snack sheet, and Diabetes Resources  If problems or questions, patient to contact team via:  Phone  Future DSME appointment: PRN

## 2022-11-19 NOTE — Patient Instructions (Addendum)
Rotate your insulin injection sites  Plan:  Aim for 3-4 Carb Choices per meal (45-60 grams) +/- 1 either way  Aim for 0-1 Carbs per snack if hungry  Include protein in moderation with your meals and snacks Consider reading food labels for Total Carbohydrate of foods Consider  increasing your activity level by walking for 60 minutes daily as tolerated Consider checking BG at alternate times per day  Consider taking medication as directed by MD   Yearly eye appointment- please make an appointment for a diabetes eye exam- This clinic or one of your choice: [facility] [phone]

## 2023-01-06 DIAGNOSIS — F4310 Post-traumatic stress disorder, unspecified: Secondary | ICD-10-CM | POA: Diagnosis not present

## 2023-01-06 DIAGNOSIS — F32A Depression, unspecified: Secondary | ICD-10-CM | POA: Diagnosis not present

## 2023-01-06 DIAGNOSIS — L603 Nail dystrophy: Secondary | ICD-10-CM | POA: Diagnosis not present

## 2023-01-06 DIAGNOSIS — E119 Type 2 diabetes mellitus without complications: Secondary | ICD-10-CM | POA: Diagnosis not present

## 2023-01-25 DIAGNOSIS — H524 Presbyopia: Secondary | ICD-10-CM | POA: Diagnosis not present

## 2023-01-25 DIAGNOSIS — E119 Type 2 diabetes mellitus without complications: Secondary | ICD-10-CM | POA: Diagnosis not present

## 2023-01-25 DIAGNOSIS — Z961 Presence of intraocular lens: Secondary | ICD-10-CM | POA: Diagnosis not present

## 2023-01-25 DIAGNOSIS — Z0100 Encounter for examination of eyes and vision without abnormal findings: Secondary | ICD-10-CM | POA: Diagnosis not present

## 2023-01-25 DIAGNOSIS — E113293 Type 2 diabetes mellitus with mild nonproliferative diabetic retinopathy without macular edema, bilateral: Secondary | ICD-10-CM | POA: Diagnosis not present

## 2023-01-25 DIAGNOSIS — Z0101 Encounter for examination of eyes and vision with abnormal findings: Secondary | ICD-10-CM | POA: Diagnosis not present
